# Patient Record
Sex: Male | Born: 1960 | Race: White | Hispanic: No | Marital: Married | Smoking: Former smoker
Health system: Southern US, Community
[De-identification: ages and names within clinical notes are randomized; demographics above are authoritative.]

## PROBLEM LIST (undated history)

## (undated) DIAGNOSIS — G4733 Obstructive sleep apnea (adult) (pediatric): Secondary | ICD-10-CM

## (undated) DIAGNOSIS — E785 Hyperlipidemia, unspecified: Secondary | ICD-10-CM

## (undated) HISTORY — DX: Obstructive sleep apnea (adult) (pediatric): G47.33

## (undated) HISTORY — PX: SHOULDER SURGERY: SHX246

## (undated) HISTORY — PX: NASAL SINUS SURGERY: SHX719

## (undated) HISTORY — DX: Hyperlipidemia, unspecified: E78.5

---

## 2002-07-26 ENCOUNTER — Ambulatory Visit (HOSPITAL_BASED_OUTPATIENT_CLINIC_OR_DEPARTMENT_OTHER): Admission: RE | Admit: 2002-07-26 | Discharge: 2002-07-26 | Payer: Self-pay | Admitting: Otolaryngology

## 2003-06-24 ENCOUNTER — Ambulatory Visit (HOSPITAL_COMMUNITY): Admission: RE | Admit: 2003-06-24 | Discharge: 2003-06-24 | Payer: Self-pay | Admitting: Family Medicine

## 2004-07-15 ENCOUNTER — Encounter: Admission: RE | Admit: 2004-07-15 | Discharge: 2004-07-15 | Payer: Self-pay | Admitting: Family Medicine

## 2007-02-18 ENCOUNTER — Emergency Department (HOSPITAL_COMMUNITY): Admission: EM | Admit: 2007-02-18 | Discharge: 2007-02-18 | Payer: Self-pay | Admitting: Emergency Medicine

## 2009-01-03 ENCOUNTER — Encounter: Admission: RE | Admit: 2009-01-03 | Discharge: 2009-01-03 | Payer: Self-pay | Admitting: Family Medicine

## 2009-01-13 ENCOUNTER — Encounter: Admission: RE | Admit: 2009-01-13 | Discharge: 2009-02-25 | Payer: Self-pay | Admitting: Family Medicine

## 2009-11-25 ENCOUNTER — Ambulatory Visit (HOSPITAL_BASED_OUTPATIENT_CLINIC_OR_DEPARTMENT_OTHER): Admission: RE | Admit: 2009-11-25 | Discharge: 2009-11-25 | Payer: Self-pay | Admitting: Otolaryngology

## 2009-11-29 ENCOUNTER — Ambulatory Visit: Payer: Self-pay | Admitting: Internal Medicine

## 2012-07-27 ENCOUNTER — Other Ambulatory Visit: Payer: Self-pay | Admitting: Specialist

## 2012-07-27 DIAGNOSIS — M25511 Pain in right shoulder: Secondary | ICD-10-CM

## 2012-08-01 ENCOUNTER — Ambulatory Visit
Admission: RE | Admit: 2012-08-01 | Discharge: 2012-08-01 | Disposition: A | Discharge status: Home / Self Care | Payer: BC Managed Care – PPO | Source: Ambulatory Visit | Attending: Specialist | Admitting: Specialist

## 2012-08-01 DIAGNOSIS — M25511 Pain in right shoulder: Secondary | ICD-10-CM

## 2013-07-23 ENCOUNTER — Ambulatory Visit: Payer: BC Managed Care – PPO | Admitting: Cardiology

## 2013-07-30 ENCOUNTER — Ambulatory Visit (INDEPENDENT_AMBULATORY_CARE_PROVIDER_SITE_OTHER): Payer: BC Managed Care – PPO | Admitting: Radiology

## 2013-07-30 ENCOUNTER — Encounter (INDEPENDENT_AMBULATORY_CARE_PROVIDER_SITE_OTHER): Payer: Self-pay

## 2013-07-30 DIAGNOSIS — R209 Unspecified disturbances of skin sensation: Secondary | ICD-10-CM

## 2013-07-30 DIAGNOSIS — G5600 Carpal tunnel syndrome, unspecified upper limb: Secondary | ICD-10-CM

## 2013-07-30 NOTE — Procedures (Signed)
     HISTORY:  Johnathan Grant is a 52 year old gentleman with a history of numbness in the first 3 fingers of the left hand off and on over the last one year prior to this evaluation. Similar symptoms are noted on the right and involving the fourth and fifth fingers. The patient has some shoulder discomfort, left arm pain. The patient is being evaluated for possible neuropathy.  NERVE CONDUCTION STUDIES:  Nerve conduction studies were performed on both upper extremities. The distal motor latencies for the median nerves were prolonged on the left, normal on the right, with normal motor amplitudes for these nerves bilaterally. The distal motor latencies and motor amplitudes for the ulnar nerves were normal bilaterally. The F wave latencies and nerve conduction velocities for the median and ulnar nerves were normal bilaterally. The sensory latencies for the median nerves were prolonged on the left, normal on the right, and normal for the ulnar and radial nerves bilaterally.  EMG STUDIES:  EMG evaluation was not performed.  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 No other significant abnormalities were seen. We would recommend EMG evaluation of the affected extremity a cervical radiculopathy is clinically suspected.  Marlan Palau MD 07/30/2013 4:38 PM  [facility] [address] [place], [place] [address]  Phone [phone] Fax [phone]

## 2013-08-24 ENCOUNTER — Encounter: Payer: Self-pay | Admitting: Cardiology

## 2013-08-24 ENCOUNTER — Ambulatory Visit (INDEPENDENT_AMBULATORY_CARE_PROVIDER_SITE_OTHER): Payer: BC Managed Care – PPO | Admitting: Cardiology

## 2013-08-24 VITALS — BP 148/90 | HR 81 | Ht 69.5 in | Wt 195.0 lb

## 2013-08-24 DIAGNOSIS — F17200 Nicotine dependence, unspecified, uncomplicated: Secondary | ICD-10-CM

## 2013-08-24 DIAGNOSIS — R079 Chest pain, unspecified: Secondary | ICD-10-CM

## 2013-08-24 DIAGNOSIS — E785 Hyperlipidemia, unspecified: Secondary | ICD-10-CM

## 2013-08-24 DIAGNOSIS — Z720 Tobacco use: Secondary | ICD-10-CM | POA: Insufficient documentation

## 2013-08-24 NOTE — Assessment & Plan Note (Signed)
Continue statin. Followed by primary care. 

## 2013-08-24 NOTE — Patient Instructions (Signed)
Your physician recommends that you schedule a follow-up appointment in: AS NEEDED  Your physician has requested that you have an exercise tolerance test. For further information please visit [email or web address]. Please also follow instruction sheet, as given.    Exercise Stress Electrocardiogram An exercise stress electrocardiogram is a test to check how blood flows to your heart. It is done to find areas of poor blood flow. You will need to walk on a treadmill for this test. The electrocardiogram will record your heartbeat when you are at rest and when you are exercising. BEFORE THE PROCEDURE  Do not have drinks with caffeine or foods with caffeine for 24 hours before the test, or as told by your doctor.  Follow your doctor's instructions about eating and drinking before the test.  Ask your doctor what medicines you should or should not take before the test. Take your medicines with water unless told by your doctor not to.  If you use an inhaler, bring it with you to the test.  Bring a snack to eat after the test.  Do not  smoke for 4 hours before the test.  Wear comfortable shoes and clothing. PROCEDURE  You will have patches put on your chest. Small areas of your chest may need to be shaved. Wires will be connected to the patches.  Your heart rate will be watched while you are resting and while you are exercising.  You will walk on the treadmill. The treadmill will slowly get faster to raise your heart rate.  The test will take about 1 2 hours. AFTER THE PROCEDURE  Your heart rate and blood pressure will be watched after the test.  You may return to your normal diet, activities, and medicines or as told by your doctor. Document Released: 01/12/2008 Document Revised: 05/16/2013 Document Reviewed: 04/02/2013 [facility] Patient Information 2014 [place].

## 2013-08-24 NOTE — Assessment & Plan Note (Signed)
Symptoms are atypical but multiple risk factors. Plan exercise treadmill.

## 2013-08-24 NOTE — Progress Notes (Signed)
     HPI: 53 year-old male for evaluation of chest pain. Patient apparently had an evaluation 5 years ago for chest pain. He had a negative nuclear study by his report. I do not have those records available. The patient states he has occasional pain in the left chest area. It occurs with vigorous activities. It lasts for 1 second and resolve spontaneously. These are similar to his symptoms 5 years ago when he had his nuclear study. The pain does not radiate, is not positional and is not related to food. No associated symptoms. He otherwise has dyspnea with more extreme activities but not routine activities. No orthopnea, PND or pedal edema. Because of the above we were asked to evaluate.  Current Outpatient Prescriptions  Medication Sig Dispense Refill  . atorvastatin (LIPITOR) 10 MG tablet Take 10 mg by mouth daily.      . Cetirizine HCl (ZYRTEC ALLERGY PO) Take by mouth as needed.      . fluticasone (FLONASE) 50 MCG/ACT nasal spray Place into both nostrils as needed for allergies or rhinitis.      Marland Kitchen. ibuprofen (ADVIL,MOTRIN) 200 MG tablet Take 200 mg by mouth every 6 (six) hours as needed.       No current facility-administered medications for this visit.    No Known Allergies  Past Medical History  Diagnosis Date  . Hyperlipidemia   . OSA (obstructive sleep apnea)     Past Surgical History  Procedure Laterality Date  . Shoulder surgery Left   . Nasal sinus surgery      History   Social History  . Marital Status: Married    Spouse Name: N/A    Number of Children: 3  . Years of Education: N/A   Occupational History  .  [employer]   Social History Main Topics  . Smoking status: Light Tobacco Smoker  . Smokeless tobacco: Not on file     Comment: Occasional smoker at poker games  . Alcohol Use: Yes  . Drug Use: Not on file  . Sexual Activity: Not on file   Other Topics Concern  . Not on file   Social History Narrative  . No narrative on file    Family  History  Problem Relation Age of Onset  . CAD Father     PCI at age 54  . Hypertension Mother   . CAD Sister     ROS: no fevers or chills, productive cough, hemoptysis, dysphasia, odynophagia, melena, hematochezia, dysuria, hematuria, rash, seizure activity, orthopnea, PND, pedal edema, claudication. Remaining systems are negative.  Physical Exam:   Blood pressure 148/90, pulse 81, height 5' 9.5" (1.765 m), weight 195 lb (88.451 kg).  General:  Well developed/well nourished in NAD Skin warm/dry Patient not depressed No peripheral clubbing Back-normal HEENT-normal/normal eyelids Neck supple/normal carotid upstroke bilaterally; no bruits; no JVD; no thyromegaly chest - CTA/ normal expansion CV - RRR/normal S1 and S2; no murmurs, rubs or gallops;  PMI nondisplaced Abdomen -NT/ND, no HSM, no mass, + bowel sounds, no bruit 2+ femoral pulses, no bruits Ext-no edema, chords, 2+ DP Neuro-grossly nonfocal  ECG Sinus rhythm, no ST changes.

## 2013-08-24 NOTE — Assessment & Plan Note (Signed)
Patient counseled on discontinuing. 

## 2013-08-28 ENCOUNTER — Ambulatory Visit (HOSPITAL_COMMUNITY)
Admission: RE | Admit: 2013-08-28 | Discharge: 2013-08-28 | Disposition: A | Discharge status: Home / Self Care | Payer: BC Managed Care – PPO | Source: Ambulatory Visit | Attending: Internal Medicine | Admitting: Internal Medicine

## 2013-08-28 DIAGNOSIS — R079 Chest pain, unspecified: Secondary | ICD-10-CM

## 2014-10-11 ENCOUNTER — Emergency Department (HOSPITAL_COMMUNITY): Payer: 59

## 2014-10-11 ENCOUNTER — Other Ambulatory Visit: Payer: Self-pay

## 2014-10-11 ENCOUNTER — Other Ambulatory Visit (HOSPITAL_COMMUNITY): Payer: Self-pay

## 2014-10-11 ENCOUNTER — Inpatient Hospital Stay (HOSPITAL_COMMUNITY)
Admission: EM | Admit: 2014-10-11 | Discharge: 2014-10-12 | DRG: 871 | Disposition: A | Discharge status: Home / Self Care | Payer: 59 | Attending: Internal Medicine | Admitting: Internal Medicine

## 2014-10-11 ENCOUNTER — Encounter (HOSPITAL_COMMUNITY): Payer: Self-pay | Admitting: Emergency Medicine

## 2014-10-11 DIAGNOSIS — E785 Hyperlipidemia, unspecified: Secondary | ICD-10-CM | POA: Diagnosis present

## 2014-10-11 DIAGNOSIS — F17210 Nicotine dependence, cigarettes, uncomplicated: Secondary | ICD-10-CM | POA: Diagnosis present

## 2014-10-11 DIAGNOSIS — R0902 Hypoxemia: Secondary | ICD-10-CM

## 2014-10-11 DIAGNOSIS — E860 Dehydration: Secondary | ICD-10-CM | POA: Diagnosis present

## 2014-10-11 DIAGNOSIS — E876 Hypokalemia: Secondary | ICD-10-CM | POA: Diagnosis present

## 2014-10-11 DIAGNOSIS — J101 Influenza due to other identified influenza virus with other respiratory manifestations: Secondary | ICD-10-CM

## 2014-10-11 DIAGNOSIS — R0600 Dyspnea, unspecified: Secondary | ICD-10-CM

## 2014-10-11 DIAGNOSIS — A419 Sepsis, unspecified organism: Principal | ICD-10-CM | POA: Diagnosis present

## 2014-10-11 DIAGNOSIS — J189 Pneumonia, unspecified organism: Secondary | ICD-10-CM | POA: Diagnosis present

## 2014-10-11 DIAGNOSIS — R509 Fever, unspecified: Secondary | ICD-10-CM

## 2014-10-11 DIAGNOSIS — I493 Ventricular premature depolarization: Secondary | ICD-10-CM | POA: Diagnosis present

## 2014-10-11 DIAGNOSIS — G4733 Obstructive sleep apnea (adult) (pediatric): Secondary | ICD-10-CM | POA: Diagnosis present

## 2014-10-11 DIAGNOSIS — J209 Acute bronchitis, unspecified: Secondary | ICD-10-CM | POA: Diagnosis present

## 2014-10-11 DIAGNOSIS — R05 Cough: Secondary | ICD-10-CM | POA: Diagnosis present

## 2014-10-11 LAB — CBC WITH DIFFERENTIAL/PLATELET
Basophils Absolute: 0 10*3/uL (ref 0.0–0.1)
Basophils Relative: 0 % (ref 0–1)
Eosinophils Absolute: 0 10*3/uL (ref 0.0–0.7)
Eosinophils Relative: 0 % (ref 0–5)
HCT: 39.3 % (ref 39.0–52.0)
Hemoglobin: 13.8 g/dL (ref 13.0–17.0)
Lymphocytes Relative: 7 % — ABNORMAL LOW (ref 12–46)
Lymphs Abs: 0.7 10*3/uL (ref 0.7–4.0)
MCH: 30.7 pg (ref 26.0–34.0)
MCHC: 35.1 g/dL (ref 30.0–36.0)
MCV: 87.5 fL (ref 78.0–100.0)
Monocytes Absolute: 0.6 10*3/uL (ref 0.1–1.0)
Monocytes Relative: 6 % (ref 3–12)
Neutro Abs: 8.2 10*3/uL — ABNORMAL HIGH (ref 1.7–7.7)
Neutrophils Relative %: 87 % — ABNORMAL HIGH (ref 43–77)
Platelets: 174 10*3/uL (ref 150–400)
RBC: 4.49 MIL/uL (ref 4.22–5.81)
RDW: 12.7 % (ref 11.5–15.5)
WBC: 9.4 10*3/uL (ref 4.0–10.5)

## 2014-10-11 LAB — COMPREHENSIVE METABOLIC PANEL
ALT: 56 U/L — ABNORMAL HIGH (ref 0–53)
AST: 54 U/L — ABNORMAL HIGH (ref 0–37)
Albumin: 3.8 g/dL (ref 3.5–5.2)
Alkaline Phosphatase: 50 U/L (ref 39–117)
Anion gap: 10 (ref 5–15)
BUN: 11 mg/dL (ref 6–23)
CO2: 24 mmol/L (ref 19–32)
Calcium: 8.3 mg/dL — ABNORMAL LOW (ref 8.4–10.5)
Chloride: 100 mmol/L (ref 96–112)
Creatinine, Ser: 1.02 mg/dL (ref 0.50–1.35)
GFR calc Af Amer: >90 mL/min (ref >90)
GFR calc non Af Amer: 81 mL/min — ABNORMAL LOW (ref >90)
Glucose, Bld: 127 mg/dL — ABNORMAL HIGH (ref 70–99)
Potassium: 3.3 mmol/L — ABNORMAL LOW (ref 3.5–5.1)
Sodium: 134 mmol/L — ABNORMAL LOW (ref 135–145)
Total Bilirubin: 0.7 mg/dL (ref 0.3–1.2)
Total Protein: 6.2 g/dL (ref 6.0–8.3)

## 2014-10-11 LAB — HEPATIC FUNCTION PANEL
ALK PHOS: 45 U/L (ref 39–117)
ALT: 57 U/L — AB (ref 0–53)
AST: 54 U/L — ABNORMAL HIGH (ref 0–37)
Albumin: 3.3 g/dL — ABNORMAL LOW (ref 3.5–5.2)
Total Bilirubin: 0.6 mg/dL (ref 0.3–1.2)
Total Protein: 5.4 g/dL — ABNORMAL LOW (ref 6.0–8.3)

## 2014-10-11 LAB — INFLUENZA PANEL BY PCR (TYPE A & B)
H1N1FLUPCR: NOT DETECTED
INFLAPCR: POSITIVE — AB
Influenza B By PCR: NEGATIVE

## 2014-10-11 LAB — CREATININE, SERUM
Creatinine, Ser: 0.99 mg/dL (ref 0.50–1.35)
GFR calc non Af Amer: >90 mL/min (ref >90)

## 2014-10-11 LAB — URINALYSIS, ROUTINE W REFLEX MICROSCOPIC
BILIRUBIN URINE: NEGATIVE
Glucose, UA: NEGATIVE mg/dL
HGB URINE DIPSTICK: NEGATIVE
Ketones, ur: NEGATIVE mg/dL
Leukocytes, UA: NEGATIVE
Nitrite: NEGATIVE
Protein, ur: NEGATIVE mg/dL
UROBILINOGEN UA: 0.2 mg/dL (ref 0.0–1.0)
pH: 5.5 (ref 5.0–8.0)

## 2014-10-11 LAB — D-DIMER, QUANTITATIVE (NOT AT ARMC): D DIMER QUANT: 0.65 ug{FEU}/mL — AB (ref 0.00–0.48)

## 2014-10-11 LAB — TROPONIN I: Troponin I: <0.03 ng/mL (ref <0.031)

## 2014-10-11 LAB — I-STAT CG4 LACTIC ACID, ED
LACTIC ACID, VENOUS: 2.2 mmol/L — AB (ref 0.5–2.0)
Lactic Acid, Venous: 1.78 mmol/L (ref 0.5–2.0)

## 2014-10-11 LAB — MAGNESIUM: Magnesium: 1.6 mg/dL (ref 1.5–2.5)

## 2014-10-11 LAB — I-STAT TROPONIN, ED: Troponin i, poc: 0 ng/mL (ref 0.00–0.08)

## 2014-10-11 LAB — BRAIN NATRIURETIC PEPTIDE: B Natriuretic Peptide: 85.5 pg/mL (ref 0.0–100.0)

## 2014-10-11 MED ORDER — ATORVASTATIN CALCIUM 10 MG PO TABS
10.0000 mg | ORAL_TABLET | Freq: Every day | ORAL | Status: DC
  Administered 2014-10-11 – 2014-10-12 (×2): 10 mg via ORAL
  Filled 2014-10-11 (×2): qty 1

## 2014-10-11 MED ORDER — OXYCODONE HCL 5 MG PO TABS: 5.0000 mg | ORAL_TABLET | ORAL | Status: DC | PRN

## 2014-10-11 MED ORDER — SODIUM CHLORIDE 0.9 % IV SOLN: INTRAVENOUS | Status: DC

## 2014-10-11 MED ORDER — OSELTAMIVIR PHOSPHATE 75 MG PO CAPS
75.0000 mg | ORAL_CAPSULE | Freq: Two times a day (BID) | ORAL | Status: DC
  Administered 2014-10-11 – 2014-10-12 (×2): 75 mg via ORAL
  Filled 2014-10-11 (×3): qty 1

## 2014-10-11 MED ORDER — FLUTICASONE PROPIONATE 50 MCG/ACT NA SUSP
1.0000 | NASAL | Status: DC | PRN
  Filled 2014-10-11: qty 16

## 2014-10-11 MED ORDER — DEXTROSE 5 % IV SOLN
500.0000 mg | INTRAVENOUS | Status: DC
  Filled 2014-10-11: qty 500

## 2014-10-11 MED ORDER — ENOXAPARIN SODIUM 40 MG/0.4ML ~~LOC~~ SOLN
40.0000 mg | SUBCUTANEOUS | Status: DC
  Administered 2014-10-11: 40 mg via SUBCUTANEOUS
  Filled 2014-10-11 (×2): qty 0.4

## 2014-10-11 MED ORDER — DEXTROSE 5 % IV SOLN
1.0000 g | INTRAVENOUS | Status: DC
  Filled 2014-10-11: qty 10

## 2014-10-11 MED ORDER — SODIUM CHLORIDE 0.9 % IJ SOLN
3.0000 mL | Freq: Two times a day (BID) | INTRAMUSCULAR | Status: DC
  Administered 2014-10-11 – 2014-10-12 (×2): 3 mL via INTRAVENOUS

## 2014-10-11 MED ORDER — ACETAMINOPHEN 325 MG PO TABS
650.0000 mg | ORAL_TABLET | Freq: Four times a day (QID) | ORAL | Status: DC | PRN
  Administered 2014-10-11: 650 mg via ORAL
  Filled 2014-10-11: qty 2

## 2014-10-11 MED ORDER — LEVALBUTEROL HCL 0.63 MG/3ML IN NEBU
0.6300 mg | INHALATION_SOLUTION | Freq: Four times a day (QID) | RESPIRATORY_TRACT | Status: DC
Start: 2014-10-11 — End: 2014-10-12
  Administered 2014-10-11 – 2014-10-12 (×3): 0.63 mg via RESPIRATORY_TRACT
  Filled 2014-10-11 (×7): qty 3

## 2014-10-11 MED ORDER — POTASSIUM CHLORIDE IN NACL 40-0.9 MEQ/L-% IV SOLN
INTRAVENOUS | Status: DC
  Administered 2014-10-11: 75 mL/h via INTRAVENOUS
  Filled 2014-10-11 (×3): qty 1000

## 2014-10-11 MED ORDER — METHYLPREDNISOLONE SODIUM SUCC 125 MG IJ SOLR
60.0000 mg | Freq: Two times a day (BID) | INTRAMUSCULAR | Status: DC
  Administered 2014-10-12: 60 mg via INTRAVENOUS
  Filled 2014-10-11 (×2): qty 0.96
  Filled 2014-10-11: qty 2

## 2014-10-11 MED ORDER — SODIUM CHLORIDE 0.9 % IV BOLUS (SEPSIS)
1000.0000 mL | INTRAVENOUS | Status: AC
  Administered 2014-10-11 (×3): 1000 mL via INTRAVENOUS

## 2014-10-11 MED ORDER — IPRATROPIUM BROMIDE 0.02 % IN SOLN
0.5000 mg | Freq: Four times a day (QID) | RESPIRATORY_TRACT | Status: DC
  Administered 2014-10-11 – 2014-10-12 (×3): 0.5 mg via RESPIRATORY_TRACT
  Filled 2014-10-11 (×3): qty 2.5

## 2014-10-11 MED ORDER — ACETAMINOPHEN 650 MG RE SUPP: 650.0000 mg | Freq: Four times a day (QID) | RECTAL | Status: DC | PRN

## 2014-10-11 MED ORDER — DEXTROSE 5 % IV SOLN
500.0000 mg | Freq: Once | INTRAVENOUS | Status: AC
  Administered 2014-10-11: 500 mg via INTRAVENOUS
  Filled 2014-10-11: qty 500

## 2014-10-11 MED ORDER — ONDANSETRON HCL 4 MG PO TABS: 4.0000 mg | ORAL_TABLET | Freq: Four times a day (QID) | ORAL | Status: DC | PRN

## 2014-10-11 MED ORDER — ONDANSETRON HCL 4 MG/2ML IJ SOLN: 4.0000 mg | Freq: Four times a day (QID) | INTRAMUSCULAR | Status: DC | PRN

## 2014-10-11 MED ORDER — IOHEXOL 350 MG/ML SOLN
80.0000 mL | Freq: Once | INTRAVENOUS | Status: AC | PRN
  Administered 2014-10-11: 80 mL via INTRAVENOUS

## 2014-10-11 MED ORDER — DEXTROSE 5 % IV SOLN
1.0000 g | Freq: Once | INTRAVENOUS | Status: AC
  Administered 2014-10-11: 1 g via INTRAVENOUS
  Filled 2014-10-11: qty 10

## 2014-10-11 MED ORDER — METHYLPREDNISOLONE SODIUM SUCC 125 MG IJ SOLR
125.0000 mg | Freq: Once | INTRAMUSCULAR | Status: AC
  Administered 2014-10-11: 125 mg via INTRAVENOUS
  Filled 2014-10-11: qty 2

## 2014-10-11 MED ORDER — IPRATROPIUM-ALBUTEROL 0.5-2.5 (3) MG/3ML IN SOLN
3.0000 mL | Freq: Once | RESPIRATORY_TRACT | Status: AC
  Administered 2014-10-11: 3 mL via RESPIRATORY_TRACT
  Filled 2014-10-11: qty 3

## 2014-10-11 NOTE — ED Provider Notes (Signed)
CSN: [id number]     Arrival date & time 10/11/14  1304 History   First MD Initiated Contact with Patient 10/11/14 1329     Chief Complaint  Patient presents with  . Shortness of Breath  . Fever     (Consider location/radiation/quality/duration/timing/severity/associated sxs/prior Treatment) HPI The patient went out of town on Sunday by airlines to [place]. He reports on the trip out he sat next to a small child who coughed throughout the trip. He worked on Monday and Tuesday and began to get symptoms of chest congestion, cough and achiness/fatigue.Marland Kitchen. He returned home yesterday evening and has developed a fever up to 103 Tmax. His cough and shortness of breath worsened. The patient denies generalized bodyaches or headache. He denies swelling of his legs or pain behind the calves or knees. He saw his family doctor in the office today and was noted to be hypoxic and febrile. He was sent to the emergency department with concerns for pneumonia. The patient denies he's had any vomiting or diarrhea. He has had no rashes and no joint swelling. The patient has not had influenza shot this year. Past Medical History  Diagnosis Date  . Hyperlipidemia   . OSA (obstructive sleep apnea)    Past Surgical History  Procedure Laterality Date  . Shoulder surgery Left   . Nasal sinus surgery     Family History  Problem Relation Age of Onset  . CAD Father     PCI at age 55  . Hypertension Mother   . CAD Sister    History  Substance Use Topics  . Smoking status: Light Tobacco Smoker  . Smokeless tobacco: Not on file     Comment: Occasional smoker at poker games  . Alcohol Use: Yes    Review of Systems 10 Systems reviewed and are negative for acute change except as noted in the HPI.    Allergies  Review of patient's allergies indicates no known allergies.  Home Medications   Prior to Admission medications   Medication Sig Start Date End Date Taking? Authorizing Provider  atorvastatin  (LIPITOR) 10 MG tablet Take 10 mg by mouth daily.   Yes Historical Provider, MD  Cetirizine HCl (ZYRTEC ALLERGY PO) Take 10 mg by mouth as needed (allergies, congestion).    Yes Historical Provider, MD  fluticasone (FLONASE) 50 MCG/ACT nasal spray Place 1 spray into both nostrils as needed for allergies or rhinitis.    Yes Historical Provider, MD  ibuprofen (ADVIL,MOTRIN) 200 MG tablet Take 200 mg by mouth every 6 (six) hours as needed.   Yes Historical Provider, MD   BP 103/59 mmHg  Pulse 93  Temp(Src) 100.6 F (38.1 C) (Oral)  Resp 19  Wt 194 lb (87.998 kg)  SpO2 94% Physical Exam  Constitutional: He is oriented to person, place, and time. He appears well-developed and well-nourished.  The patient is alert and nontoxic. He is mildly tachypnea.  HENT:  Head: Normocephalic and atraumatic.  Right Ear: External ear normal.  Left Ear: External ear normal.  Nose: Nose normal.  Mouth/Throat: Oropharynx is clear and moist. No oropharyngeal exudate.  Eyes: Conjunctivae and EOM are normal. Pupils are equal, round, and reactive to light. Right eye exhibits no discharge. Left eye exhibits no discharge.  Neck: Neck supple.  Cardiovascular: Normal heart sounds and intact distal pulses.   Tachycardia. No rub murmur gallop. Normal peripheral pulses.  Pulmonary/Chest: He has no rales.  Patient has mild tachypnea. He is speaking in full sentences and minimal  respiratory distress. Breath sounds are diminished at the bases and there are fine expiratory wheeze.  Abdominal: Soft. Bowel sounds are normal. He exhibits no distension and no mass. There is no tenderness. There is no guarding.  Musculoskeletal: Normal range of motion. He exhibits no edema or tenderness.  Lymphadenopathy:    He has no cervical adenopathy.  Neurological: He is alert and oriented to person, place, and time. No cranial nerve deficit. He exhibits normal muscle tone. Coordination normal.  Skin: Skin is warm and dry. No rash noted.  No erythema. No pallor.  Psychiatric: He has a normal mood and affect.    ED Course  Procedures (including critical care time) Labs Review Labs Reviewed  CBC WITH DIFFERENTIAL/PLATELET - Abnormal; Notable for the following:    Neutrophils Relative % 87 (*)    Neutro Abs 8.2 (*)    Lymphocytes Relative 7 (*)    All other components within normal limits  D-DIMER, QUANTITATIVE - Abnormal; Notable for the following:    D-Dimer, Quant 0.65 (*)    All other components within normal limits  CULTURE, BLOOD (ROUTINE X 2)  CULTURE, BLOOD (ROUTINE X 2)  URINE CULTURE  COMPREHENSIVE METABOLIC PANEL  URINALYSIS, ROUTINE W REFLEX MICROSCOPIC  BRAIN NATRIURETIC PEPTIDE  TROPONIN I  INFLUENZA PANEL BY PCR (TYPE A & B, H1N1)  I-STAT TROPOININ, ED  I-STAT CG4 LACTIC ACID, ED    Imaging Review Dg Chest 2 View  10/11/2014   CLINICAL DATA:  Shortness of breath and fever since 5 days ago.  EXAM: CHEST  2 VIEW  COMPARISON:  None.  FINDINGS: Heart size is normal. Mediastinal shadows are normal. There is mild central bronchial thickening but no infiltrate, collapse or effusion. No bony abnormalities.  IMPRESSION: Mild bronchial thickening.  No consolidation or collapse.   Electronically Signed   By: Paulina Fusi M.D.   On: 10/11/2014 13:52     EKG Interpretation   Date/Time:  Friday October 11 2014 13:13:17 EST Ventricular Rate:  115 PR Interval:  134 QRS Duration: 96 QT Interval:  294 QTC Calculation: 406 R Axis:   89 Text Interpretation:  Sinus tachycardia with occasional Premature  ventricular complexes ST \\T \ T wave abnormality, consider inferolateral  ischemia Abnormal ECG agree. no STEMI Confirmed by Donnald Garre, MD, Lebron Conners  [phone]) on 10/11/2014 2:06:18 PM     CRITICAL CARE Performed by: Arby Barrette   Total critical care time: 40  Critical care time was exclusive of separately billable procedures and treating other patients.  Critical care was necessary to treat or prevent imminent  or life-threatening deterioration.  Critical care was time spent personally by me on the following activities: development of treatment plan with patient and/or surrogate as well as nursing, discussions with consultants, evaluation of patient's response to treatment, examination of patient, obtaining history from patient or surrogate, ordering and performing treatments and interventions, ordering and review of laboratory studies, ordering and review of radiographic studies, pulse oximetry and re-evaluation of patient's condition. MDM   Final diagnoses:  Hypoxia  Other specified fever  Dyspnea    Patient sent from PCP with hypoxia, fever, hypotension and myalgia with concern for pneumonia. Sepsis protocol for CAP initiated with SIRS criteria. DDx viral vs bacterial respiratory illness. PE ruled out due to travel Hx and elevated D dimer. After initial tx, pt remained significantly dyspneic with minor exertion and decreased BS and wheeze. Patient admitted for hypoxia and significant DOE with respiratory illness, r/o pneumonia/sepsis/viral pneuminia.    Lebron Conners  Donnald Garre, MD 10/14/14 1535

## 2014-10-11 NOTE — Progress Notes (Signed)
RN called for report.  

## 2014-10-11 NOTE — ED Notes (Signed)
Pt transported to CT ?

## 2014-10-11 NOTE — ED Notes (Signed)
Pt sts fever, SOB and aches x 3 days; pt sent from PCP for further eval

## 2014-10-11 NOTE — Progress Notes (Signed)
ANTIBIOTIC CONSULT NOTE - INITIAL  Pharmacy Consult for Ceftriaxone, Azithromycin Indication: pneumonia  No Known Allergies  Patient Measurements: Weight: 194 lb (87.998 kg)  Vital Signs: Temp: 100.6 F (38.1 C) (03/04 1410) Temp Source: Oral (03/04 1410) BP: 120/61 mmHg (03/04 1400) Pulse Rate: 106 (03/04 1400) Intake/Output from previous day:   Intake/Output from this shift:    Labs: No results for input(s): WBC, HGB, PLT, LABCREA, CREATININE in the last 72 hours. CrCl cannot be calculated (Unknown ideal weight.). No results for input(s): VANCOTROUGH, VANCOPEAK, VANCORANDOM, GENTTROUGH, GENTPEAK, GENTRANDOM, TOBRATROUGH, TOBRAPEAK, TOBRARND, AMIKACINPEAK, AMIKACINTROU, AMIKACIN in the last 72 hours.   Microbiology: No results found for this or any previous visit (from the past 720 hour(s)).  Medical History: Past Medical History  Diagnosis Date  . Hyperlipidemia   . OSA (obstructive sleep apnea)     Medications:  Anti-infectives    Start     Dose/Rate Route Frequency Ordered Stop   10/11/14 1430  cefTRIAXone (ROCEPHIN) 1 g in dextrose 5 % 50 mL IVPB     1 g 100 mL/hr over 30 Minutes Intravenous  Once 10/11/14 1420     10/11/14 1430  azithromycin (ZITHROMAX) 500 mg in dextrose 5 % 250 mL IVPB     500 mg 250 mL/hr over 60 Minutes Intravenous  Once 10/11/14 1420       Assessment: 54 year old male presenting with pneumonia and possible sepsis to continue antibiotic therapy with Ceftriaxone and Azithromycin.  Initial doses were ordered by the ED provider.  Plan:  Ceftriaxone 1gm IV q24h Azithromycin 500mg  IV q24h As no dosage adjustments are required pharmacy will sign off. Thank you for the consult.  Estella HuskMichelle Sovereign Ramiro, Pharm.D., BCPS, AAHIVP Clinical Pharmacist Phone: [phone] or [phone] 10/11/2014, 2:31 PM

## 2014-10-11 NOTE — H&P (Addendum)
[facility] History and Physical  Johnathan Grant ZOX:[id number] DOB: August 12, 1960 DOA: 10/11/2014  Referring physician: PCP: Darrow Bussing, MD   Chief Complaint: Shortness of breath and cough  HPI:  54 year old male with a history of obstructive sleep apnea, status post trip to [facility], who came in contact with a sick child on the plane,, presents today with cough congestion myalgias sore throat and a fever of 103. Symptoms worsening over the last couple of days. Patient saw his PCP today and was sent to the emergency department because of concerns about pneumonia. No diarrhea no vomiting.  Workup in the ED showed elevated d-dimer of 0.65. CT scan showed acute bronchitis, no pulmonary embolism  Review of Systems: negative for the following  Constitutional: Positive for fever, chills, diaphoresis, appetite change and fatigue.  HEENT: Denies photophobia, eye pain, redness, hearing loss, ear pain, congestion, sore throat, rhinorrhea, sneezing, mouth sores, trouble swallowing, neck pain, neck stiffness and tinnitus.  Respiratory: Positive for SOB, DOE, positive for cough, chest tightness, and wheezing.  Cardiovascular: Denies chest pain, palpitations and leg swelling.  Gastrointestinal: Denies nausea, vomiting, abdominal pain, diarrhea, constipation, blood in stool and abdominal distention.  Genitourinary: Denies dysuria, urgency, frequency, hematuria, flank pain and difficulty urinating.  Musculoskeletal: Denies myalgias, back pain, joint swelling, arthralgias and gait problem.  Skin: Denies pallor, rash and wound.  Neurological: Denies dizziness, seizures, syncope, weakness, light-headedness, numbness and headaches.  Hematological: Denies adenopathy. Easy bruising, personal or family bleeding history  Psychiatric/Behavioral: Denies suicidal ideation, mood changes, confusion, nervousness, sleep disturbance and agitation       Past Medical History  Diagnosis Date  . Hyperlipidemia    . OSA (obstructive sleep apnea)      Past Surgical History  Procedure Laterality Date  . Shoulder surgery Left   . Nasal sinus surgery        Social History:  reports that he has been smoking.  He does not have any smokeless tobacco history on file. He reports that he drinks alcohol. His drug history is not on file.    No Known Allergies  Family History  Problem Relation Age of Onset  . CAD Father     PCI at age 40  . Hypertension Mother   . CAD Sister           Prior to Admission medications   Medication Sig Start Date End Date Taking? Authorizing Provider  atorvastatin (LIPITOR) 10 MG tablet Take 10 mg by mouth daily.   Yes Historical Provider, MD  Cetirizine HCl (ZYRTEC ALLERGY PO) Take 10 mg by mouth as needed (allergies, congestion).    Yes Historical Provider, MD  fluticasone (FLONASE) 50 MCG/ACT nasal spray Place 1 spray into both nostrils as needed for allergies or rhinitis.    Yes Historical Provider, MD  ibuprofen (ADVIL,MOTRIN) 200 MG tablet Take 200 mg by mouth every 6 (six) hours as needed.   Yes Historical Provider, MD     Physical Exam: Filed Vitals:   10/11/14 1614 10/11/14 1630 10/11/14 1645 10/11/14 1657  BP:  119/65    Pulse: 90  102   Temp:    99.4 F (37.4 C)  TempSrc:    Oral  Resp: Weight:      SpO2: 91%  93%      Constitutional: Febrile,. Patient is a well-developed and well-nourished in no acute distress and cooperative with exam. Alert and oriented x3.  Head: Normocephalic and atraumatic  Ear: TM normal bilaterally  Mouth:  no erythema or exudates, MMM  Eyes: PERRL, EOMI, conjunctivae normal, No scleral icterus.  Neck: Supple, Trachea midline normal ROM, No JVD, mass, thyromegaly, or carotid bruit present.  Cardiovascular: Tachycardic, S1 normal, S2 normal, no MRG, pulses symmetric and intact bilaterally  Pulmonary/Chest: CTAB, by basilar wheezing wheezes, rales, or rhonchi  Abdominal: Soft. Non-tender, non-distended,  bowel sounds are normal, no masses, organomegaly, or guarding present.  GU: no CVA tenderness Musculoskeletal: No joint deformities, erythema, or stiffness, ROM full and no nontender Ext: no edema and no cyanosis, pulses palpable bilaterally (DP and PT)  Hematology: no cervical, inginal, or axillary adenopathy.  Neurological: A&O x3, Strenght is normal and symmetric bilaterally, cranial nerve II-XII are grossly intact, no focal motor deficit, sensory intact to light touch bilaterally.  Skin: Warm, dry and intact. No rash, cyanosis, or clubbing.  Psychiatric: Normal mood and affect. speech and behavior is normal. Judgment and thought content normal. Cognition and memory are normal.       Labs on Admission:    Basic Metabolic Panel:  Recent Labs Lab 10/11/14 1404  NA 134*  K 3.3*  CL 100  CO2 24  GLUCOSE 127*  BUN 11  CREATININE 1.02  CALCIUM 8.3*   Liver Function Tests:  Recent Labs Lab 10/11/14 1404  AST 54*  ALT 56*  ALKPHOS 50  BILITOT 0.7  PROT 6.2  ALBUMIN 3.8   No results for input(s): LIPASE, AMYLASE in the last 168 hours. No results for input(s): AMMONIA in the last 168 hours. CBC:  Recent Labs Lab 10/11/14 1404  WBC 9.4  NEUTROABS 8.2*  HGB 13.8  HCT 39.3  MCV 87.5  PLT 174   Cardiac Enzymes:  Recent Labs Lab 10/11/14 1404  TROPONINI <0.03    BNP (last 3 results)  Recent Labs  10/11/14 1404  BNP 85.5    ProBNP (last 3 results) No results for input(s): PROBNP in the last 8760 hours.     CBG: No results for input(s): GLUCAP in the last 168 hours.  Radiological Exams on Admission: Dg Chest 2 View  10/11/2014   CLINICAL DATA:  Shortness of breath and fever since 5 days ago.  EXAM: CHEST  2 VIEW  COMPARISON:  None.  FINDINGS: Heart size is normal. Mediastinal shadows are normal. There is mild central bronchial thickening but no infiltrate, collapse or effusion. No bony abnormalities.  IMPRESSION: Mild bronchial thickening.  No  consolidation or collapse.   Electronically Signed   By: Paulina FusiMark  Shogry M.D.   On: 10/11/2014 13:52   Ct Angio Chest Pe W/cm &/or Wo Cm  10/11/2014   CLINICAL DATA:  Shortness of breath, fever and cough, 2 days duration.  EXAM: CT ANGIOGRAPHY CHEST WITH CONTRAST  TECHNIQUE: Multidetector CT imaging of the chest was performed using the standard protocol during bolus administration of intravenous contrast. Multiplanar CT image reconstructions and MIPs were obtained to evaluate the vascular anatomy.  CONTRAST:  80mL OMNIPAQUE IOHEXOL 350 MG/ML SOLN  COMPARISON:  Chest radiography same day  FINDINGS: There is widespread bronchial thickening consistent with bronchitis. No evidence of pneumonia. There is mild-to-moderate emphysema. There is a small area of focal scarring posteriorly in the right upper lobe. No evidence of mass lesion. No pleural or pericardial fluid.  Pulmonary arterial opacification is excellent. There are no pulmonary emboli. There is mild atherosclerosis of the aorta. No aneurysm or dissection. There may be mild coronary calcification in the left system.  There is no mediastinal or hilar mass. Normal size  lymph nodes are present.  Scans in the upper abdomen are unremarkable. There is a sub cm low density in the right lobe of the liver likely to be benign.  No spinal abnormality.  Ordinary bridging osteophytes.  Review of the MIP images confirms the above findings.  IMPRESSION: No pulmonary emboli.  Widespread bronchial thickening consistent with bronchitis. No consolidation or collapse.   Electronically Signed   By: Paulina Fusi M.D.   On: 10/11/2014 16:05    EKG: Independently reviewed.  Date/Time: Friday October 11 2014 13:13:17 EST Ventricular Rate: 115 PR Interval: 134 QRS Duration: 96 QT Interval: 294 QTC Calculation: 406 R Axis: 89 Text Interpretation: Sinus tachycardia with occasional Premature  ventricular complexes  Assessment/Plan Active Problems:   Acute  bronchitis   Acute bronchitis Likely viral, cannot rule out early pneumonia Influenza PCR pending Continue Rocephin/azithromycin Continue scheduled nebulizer treatments Continue IV Solu-Medrol 60 mg every 12, received 125 mg in the ED tamiflu empirically, dc if flu pcr negative doppler to r/o dvt  Hypokalemia Likely in the setting of dehydration, we will replete and hydrate the patient  PVCs/tachycardia Likely in the setting of fever and hypokalemia Hydrate the patient  Dyslipidemia Continue Lipitor  Code Status:   full Family Communication: bedside Disposition Plan: admit   Time spent: 70 mins   [facility] Pager [phone]  If 7PM-7AM, please contact night-coverage [email or web address] Password [facility] 10/11/2014, 6:15 PM

## 2014-10-12 ENCOUNTER — Encounter (HOSPITAL_COMMUNITY): Payer: Self-pay | Admitting: *Deleted

## 2014-10-12 DIAGNOSIS — R0902 Hypoxemia: Secondary | ICD-10-CM

## 2014-10-12 DIAGNOSIS — J101 Influenza due to other identified influenza virus with other respiratory manifestations: Secondary | ICD-10-CM

## 2014-10-12 DIAGNOSIS — J208 Acute bronchitis due to other specified organisms: Secondary | ICD-10-CM

## 2014-10-12 LAB — COMPREHENSIVE METABOLIC PANEL
ALT: 100 U/L — ABNORMAL HIGH (ref 0–53)
AST: 88 U/L — ABNORMAL HIGH (ref 0–37)
Albumin: 3.5 g/dL (ref 3.5–5.2)
Alkaline Phosphatase: 48 U/L (ref 39–117)
Anion gap: 6 (ref 5–15)
BUN: 9 mg/dL (ref 6–23)
CO2: 27 mmol/L (ref 19–32)
Calcium: 8.3 mg/dL — ABNORMAL LOW (ref 8.4–10.5)
Chloride: 103 mmol/L (ref 96–112)
Creatinine, Ser: 0.79 mg/dL (ref 0.50–1.35)
GFR calc Af Amer: >90 mL/min (ref >90)
GFR calc non Af Amer: >90 mL/min (ref >90)
Glucose, Bld: 141 mg/dL — ABNORMAL HIGH (ref 70–99)
Potassium: 3.9 mmol/L (ref 3.5–5.1)
Sodium: 136 mmol/L (ref 135–145)
Total Bilirubin: 0.5 mg/dL (ref 0.3–1.2)
Total Protein: 6 g/dL (ref 6.0–8.3)

## 2014-10-12 LAB — CBC
HCT: 37.3 % — ABNORMAL LOW (ref 39.0–52.0)
Hemoglobin: 13.3 g/dL (ref 13.0–17.0)
MCH: 30.9 pg (ref 26.0–34.0)
MCHC: 35.7 g/dL (ref 30.0–36.0)
MCV: 86.7 fL (ref 78.0–100.0)
Platelets: 187 10*3/uL (ref 150–400)
RBC: 4.3 MIL/uL (ref 4.22–5.81)
RDW: 12.9 % (ref 11.5–15.5)
WBC: 8.2 10*3/uL (ref 4.0–10.5)

## 2014-10-12 MED ORDER — PREDNISONE 5 MG PO TABS: 5.0000 mg | ORAL_TABLET | Freq: Every day | ORAL | Status: DC

## 2014-10-12 MED ORDER — ALBUTEROL SULFATE HFA 108 (90 BASE) MCG/ACT IN AERS: 2.0000 | INHALATION_SPRAY | Freq: Four times a day (QID) | RESPIRATORY_TRACT | Status: AC | PRN

## 2014-10-12 MED ORDER — OSELTAMIVIR PHOSPHATE 75 MG PO CAPS: 75.0000 mg | ORAL_CAPSULE | Freq: Two times a day (BID) | ORAL | Status: DC

## 2014-10-12 NOTE — Progress Notes (Signed)
VASCULAR LAB PRELIMINARY  PRELIMINARY  PRELIMINARY  PRELIMINARY  Bilateral lower extremity venous Dopplers completed.    Preliminary report:  There is no DVT or SVT noted in the bilateral lower extremities.   Hagen Tidd, RVT 10/12/2014, 11:29 AM

## 2014-10-12 NOTE — H&P (Signed)
Physician Discharge Summary  Johnathan PERUSSE ZOX:[id number] DOB: March 25, 1961 DOA: 10/11/2014  PCP: Darrow Bussing, MD  Admit date: 10/11/2014 Discharge date: 10/12/2014  Time spent: 25 minutes  Recommendations for Outpatient Follow-up:  1. Follow up with PCP in 1-2 weeks 2. Consider prophylactic Tamiflu for close family contacts  Discharge Diagnoses:  Principal Problem:   Influenza A Active Problems:   Acute bronchitis   Discharge Condition: Improved  Diet recommendation: Regular  Filed Weights   10/11/14 1317 10/11/14 1908  Weight: 87.998 kg (194 lb) 87.998 kg (194 lb)    History of present illness:  Please see admit h and p from 3/4 for details. Briefly, pt presents with fevers, cough, sob two days after traveling out of state via air travel while sitting next to acutely ill passengers.  Hospital Course:  The patient was admitted to the floor. The patient was initially started on empiric abx to cover CAP. Flu swab was later found pos for flu A. The patient was started on tamiflu. During the course, the patient has noted improvement with bronchodilators and steroids. He will discharged home with albuterol and prednisone taper. Of note, d-dimer was mildly elevated. CTA was neg for PE. LE dopplers were neg for DVT.  Procedures: B LE dopplers - neg for DVT  Consultations:  none  Discharge Exam: Filed Vitals:   10/11/14 1908 10/11/14 2045 10/11/14 2049 10/12/14 0546  BP: 125/67  121/69 122/75  Pulse: 90  94 65  Temp: 100.2 F (37.9 C)  99.6 F (37.6 C) 98.3 F (36.8 C)  TempSrc: Oral  Oral Oral  Resp: 18  20   Height:  (1.753 m)     Weight: 87.998 kg (194 lb)     SpO2: 93% 95% 95% 98%    General: awake, in nad Cardiovascular: regular,s1, s2 Respiratory: normal resp effort, no wheezing  Discharge Instructions     Medication List    TAKE these medications        albuterol 108 (90 BASE) MCG/ACT inhaler  Commonly known as:  PROVENTIL HFA;VENTOLIN HFA   Inhale 2 puffs into the lungs every 6 (six) hours as needed for wheezing or shortness of breath.     atorvastatin 10 MG tablet  Commonly known as:  LIPITOR  Take 10 mg by mouth daily.     fluticasone 50 MCG/ACT nasal spray  Commonly known as:  FLONASE  Place 1 spray into both nostrils as needed for allergies or rhinitis.     ibuprofen 200 MG tablet  Commonly known as:  ADVIL,MOTRIN  Take 200 mg by mouth every 6 (six) hours as needed.     oseltamivir 75 MG capsule  Commonly known as:  TAMIFLU  Take 1 capsule (75 mg total) by mouth 2 (two) times daily.     predniSONE 5 MG tablet  Commonly known as:  DELTASONE  Take 1 tablet (5 mg total) by mouth daily with breakfast.     ZYRTEC ALLERGY PO  Take 10 mg by mouth as needed (allergies, congestion).       No Known Allergies Follow-up Information    Follow up with Darrow Bussing, MD. Schedule an appointment as soon as possible for a visit in 1 week.   Specialty:  Family Medicine   Contact information:   [address] [place] [address] [phone]        The results of significant diagnostics from this hospitalization (including imaging, microbiology, ancillary and laboratory) are listed below for reference.  Significant Diagnostic Studies: Dg Chest 2 View  10/11/2014   CLINICAL DATA:  Shortness of breath and fever since 5 days ago.  EXAM: CHEST  2 VIEW  COMPARISON:  None.  FINDINGS: Heart size is normal. Mediastinal shadows are normal. There is mild central bronchial thickening but no infiltrate, collapse or effusion. No bony abnormalities.  IMPRESSION: Mild bronchial thickening.  No consolidation or collapse.   Electronically Signed   By: Paulina FusiMark  Shogry M.D.   On: 10/11/2014 13:52   Ct Angio Chest Pe W/cm &/or Wo Cm  10/11/2014   CLINICAL DATA:  Shortness of breath, fever and cough, 2 days duration.  EXAM: CT ANGIOGRAPHY CHEST WITH CONTRAST  TECHNIQUE: Multidetector CT imaging of the chest was performed  using the standard protocol during bolus administration of intravenous contrast. Multiplanar CT image reconstructions and MIPs were obtained to evaluate the vascular anatomy.  CONTRAST:  80mL OMNIPAQUE IOHEXOL 350 MG/ML SOLN  COMPARISON:  Chest radiography same day  FINDINGS: There is widespread bronchial thickening consistent with bronchitis. No evidence of pneumonia. There is mild-to-moderate emphysema. There is a small area of focal scarring posteriorly in the right upper lobe. No evidence of mass lesion. No pleural or pericardial fluid.  Pulmonary arterial opacification is excellent. There are no pulmonary emboli. There is mild atherosclerosis of the aorta. No aneurysm or dissection. There may be mild coronary calcification in the left system.  There is no mediastinal or hilar mass. Normal size lymph nodes are present.  Scans in the upper abdomen are unremarkable. There is a sub cm low density in the right lobe of the liver likely to be benign.  No spinal abnormality.  Ordinary bridging osteophytes.  Review of the MIP images confirms the above findings.  IMPRESSION: No pulmonary emboli.  Widespread bronchial thickening consistent with bronchitis. No consolidation or collapse.   Electronically Signed   By: Paulina FusiMark  Shogry M.D.   On: 10/11/2014 16:05    Microbiology: Recent Results (from the past 240 hour(s))  Blood Culture (routine x 2)     Status: None (Preliminary result)   Collection Time: 10/11/14  1:59 PM  Result Value Ref Range Status   Specimen Description BLOOD UPPER BLOOD LEFT FOREARM  Final   Special Requests BOTTLES DRAWN AEROBIC AND ANAEROBIC 5CCS  Final   Culture   Final           BLOOD CULTURE RECEIVED NO GROWTH TO DATE CULTURE WILL BE HELD FOR 5 DAYS BEFORE ISSUING A FINAL NEGATIVE REPORT [facility]    Report Status PENDING  Incomplete  Blood Culture (routine x 2)     Status: None (Preliminary result)   Collection Time: 10/11/14  2:02 PM  Result Value Ref Range  Status   Specimen Description BLOOD LEFT HAND  Final   Special Requests BOTTLES DRAWN AEROBIC AND ANAEROBIC 5CCS  Final   Culture   Final           BLOOD CULTURE RECEIVED NO GROWTH TO DATE CULTURE WILL BE HELD FOR 5 DAYS BEFORE ISSUING A FINAL NEGATIVE REPORT [facility]formed at [facility]    Report Status PENDING  Incomplete     Labs: Basic Metabolic Panel:  Recent Labs Lab 10/11/14 1404 10/11/14 1800 10/12/14 0453  NA 134*  --  136  K 3.3*  --  3.9  CL 100  --  103  CO2 24  --  27  GLUCOSE 127*  --  141*  BUN 11  --  9  CREATININE 1.02 0.99 0.79  CALCIUM 8.3*  --  8.3*  MG  --  1.6  --    Liver Function Tests:  Recent Labs Lab 10/11/14 1404 10/11/14 1800 10/12/14 0453  AST 54* 54* 88*  ALT 56* 57* 100*  ALKPHOS 50 45 48  BILITOT 0.7 0.6 0.5  PROT 6.2 5.4* 6.0  ALBUMIN 3.8 3.3* 3.5   No results for input(s): LIPASE, AMYLASE in the last 168 hours. No results for input(s): AMMONIA in the last 168 hours. CBC:  Recent Labs Lab 10/11/14 1404 10/12/14 0453  WBC 9.4 8.2  NEUTROABS 8.2*  --   HGB 13.8 13.3  HCT 39.3 37.3*  MCV 87.5 86.7  PLT 174 187   Cardiac Enzymes:  Recent Labs Lab 10/11/14 1404  TROPONINI <0.03   BNP: BNP (last 3 results)  Recent Labs  10/11/14 1404  BNP 85.5    ProBNP (last 3 results) No results for input(s): PROBNP in the last 8760 hours.  CBG: No results for input(s): GLUCAP in the last 168 hours.   Signed:  CHIU, STEPHEN K  [facility] 10/12/2014, 11:35 AM

## 2014-10-12 NOTE — Discharge Summary (Signed)
Physician Discharge Summary  Renato BattlesRichard D Barkalow FAO:[id number] DOB: 04-17-1961 DOA: 10/11/2014  PCP: Darrow BussingKOIRALA,DIBAS, MD  Admit date: 10/11/2014 Discharge date: 10/12/2014  Time spent: 25 minutes  Recommendations for Outpatient Follow-up:  1. Follow up with PCP in 1-2 weeks 2. Consider prophylactic Tamiflu for close family contacts  Discharge Diagnoses:  Principal Problem:  Influenza A Active Problems:  Acute bronchitis   Discharge Condition: Improved  Diet recommendation: Regular  Filed Weights   10/11/14 1317 10/11/14 1908  Weight: 87.998 kg (194 lb) 87.998 kg (194 lb)    History of present illness:  Please see admit h and p from 3/4 for details. Briefly, pt presents with fevers, cough, sob two days after traveling out of state via air travel while sitting next to acutely ill passengers.  Hospital Course:  The patient was admitted to the floor. The patient was initially started on empiric abx to cover CAP. Flu swab was later found pos for flu A. The patient was started on tamiflu. During the course, the patient has noted improvement with bronchodilators and steroids. He will discharged home with albuterol and prednisone taper. Of note, d-dimer was mildly elevated. CTA was neg for PE. LE dopplers were neg for DVT.  Procedures: B LE dopplers - neg for DVT  Consultations:  none  Discharge Exam: Filed Vitals:   10/11/14 1908 10/11/14 2045 10/11/14 2049 10/12/14 0546  BP: 125/67  121/69 122/75  Pulse: 90  94 65  Temp: 100.2 F (37.9 C)  99.6 F (37.6 C) 98.3 F (36.8 C)  TempSrc: Oral  Oral Oral  Resp: 18  20   Height: 5\' 9"  (1.753 m)     Weight: 87.998 kg (194 lb)     SpO2: 93% 95% 95% 98%    General: awake, in nad Cardiovascular: regular,s1, s2 Respiratory: normal resp effort, no wheezing  Discharge Instructions     Medication List    TAKE these medications       albuterol 108 (90 BASE)  MCG/ACT inhaler  Commonly known as: PROVENTIL HFA;VENTOLIN HFA  Inhale 2 puffs into the lungs every 6 (six) hours as needed for wheezing or shortness of breath.     atorvastatin 10 MG tablet  Commonly known as: LIPITOR  Take 10 mg by mouth daily.     fluticasone 50 MCG/ACT nasal spray  Commonly known as: FLONASE  Place 1 spray into both nostrils as needed for allergies or rhinitis.     ibuprofen 200 MG tablet  Commonly known as: ADVIL,MOTRIN  Take 200 mg by mouth every 6 (six) hours as needed.     oseltamivir 75 MG capsule  Commonly known as: TAMIFLU  Take 1 capsule (75 mg total) by mouth 2 (two) times daily.     predniSONE 5 MG tablet  Commonly known as: DELTASONE  Take 1 tablet (5 mg total) by mouth daily with breakfast.     ZYRTEC ALLERGY PO  Take 10 mg by mouth as needed (allergies, congestion).       No Known Allergies Follow-up Information    Follow up with Darrow BussingKOIRALA,DIBAS, MD. Schedule an appointment as soon as possible for a visit in 1 week.   Specialty: Family Medicine   Contact information:   [address] [place] [address] [phone]         The results of significant diagnostics from this hospitalization (including imaging, microbiology, ancillary and laboratory) are listed below for reference.    Significant Diagnostic Studies:  Imaging Results  Dg Chest 2 View  10/11/2014 CLINICAL DATA: Shortness of breath and fever since 5 days ago. EXAM: CHEST 2 VIEW COMPARISON: None. FINDINGS: Heart size is normal. Mediastinal shadows are normal. There is mild central bronchial thickening but no infiltrate, collapse or effusion. No bony abnormalities. IMPRESSION: Mild bronchial thickening. No consolidation or collapse. Electronically Signed By: Paulina Fusi M.D. On: 10/11/2014 13:52   Ct Angio Chest Pe W/cm &/or Wo Cm  10/11/2014 CLINICAL DATA: Shortness of breath, fever  and cough, 2 days duration. EXAM: CT ANGIOGRAPHY CHEST WITH CONTRAST TECHNIQUE: Multidetector CT imaging of the chest was performed using the standard protocol during bolus administration of intravenous contrast. Multiplanar CT image reconstructions and MIPs were obtained to evaluate the vascular anatomy. CONTRAST: 80mL OMNIPAQUE IOHEXOL 350 MG/ML SOLN COMPARISON: Chest radiography same day FINDINGS: There is widespread bronchial thickening consistent with bronchitis. No evidence of pneumonia. There is mild-to-moderate emphysema. There is a small area of focal scarring posteriorly in the right upper lobe. No evidence of mass lesion. No pleural or pericardial fluid. Pulmonary arterial opacification is excellent. There are no pulmonary emboli. There is mild atherosclerosis of the aorta. No aneurysm or dissection. There may be mild coronary calcification in the left system. There is no mediastinal or hilar mass. Normal size lymph nodes are present. Scans in the upper abdomen are unremarkable. There is a sub cm low density in the right lobe of the liver likely to be benign. No spinal abnormality. Ordinary bridging osteophytes. Review of the MIP images confirms the above findings. IMPRESSION: No pulmonary emboli. Widespread bronchial thickening consistent with bronchitis. No consolidation or collapse. Electronically Signed By: Paulina Fusi M.D. On: 10/11/2014 16:05     Microbiology: Recent Results (from the past 240 hour(s))  Blood Culture (routine x 2) Status: None (Preliminary result)   Collection Time: 10/11/14 1:59 PM  Result Value Ref Range Status   Specimen Description BLOOD UPPER BLOOD LEFT FOREARM  Final   Special Requests BOTTLES DRAWN AEROBIC AND ANAEROBIC 5CCS  Final   Culture   Final     BLOOD CULTURE RECEIVED NO GROWTH TO DATE CULTURE WILL BE HELD FOR 5 DAYS BEFORE ISSUING A FINAL NEGATIVE REPORT Performed at [facility]     Report Status PENDING  Incomplete  Blood Culture (routine x 2) Status: None (Preliminary result)   Collection Time: 10/11/14 2:02 PM  Result Value Ref Range Status   Specimen Description BLOOD LEFT HAND  Final   Special Requests BOTTLES DRAWN AEROBIC AND ANAEROBIC 5CCS  Final   Culture   Final     BLOOD CULTURE RECEIVED NO GROWTH TO DATE CULTURE WILL BE HELD FOR 5 DAYS BEFORE ISSUING A FINAL NEGATIVE REPORT Performed at [facility]    Report Status PENDING  Incomplete     Labs: Basic Metabolic Panel:  Last Labs      Recent Labs Lab 10/11/14 1404 10/11/14 1800 10/12/14 0453  NA 134* --  136  K 3.3* --  3.9  CL 100 --  103  CO2 24 --  27  GLUCOSE 127* --  141*  BUN 11 --  9  CREATININE 1.02 0.99 0.79  CALCIUM 8.3* --  8.3*  MG --  1.6 --      Liver Function Tests:  Last Labs      Recent Labs Lab 10/11/14 1404 10/11/14 1800 10/12/14 0453  AST 54* 54* 88*  ALT 56* 57* 100*  ALKPHOS 50 45 48  BILITOT 0.7 0.6 0.5  PROT  6.2 5.4* 6.0  ALBUMIN 3.8 3.3* 3.5      Last Labs     No results for input(s): LIPASE, AMYLASE in the last 168 hours.    Last Labs     No results for input(s): AMMONIA in the last 168 hours.   CBC:  Last Labs      Recent Labs Lab 10/11/14 1404 10/12/14 0453  WBC 9.4 8.2  NEUTROABS 8.2* --   HGB 13.8 13.3  HCT 39.3 37.3*  MCV 87.5 86.7  PLT 174 187     Cardiac Enzymes:  Last Labs      Recent Labs Lab 10/11/14 1404  TROPONINI <0.03     BNP: BNP (last 3 results)  Recent Labs (within last 365 days)     Recent Labs  10/11/14 1404  BNP 85.5      ProBNP (last 3 results)  Recent Labs (within last 365 days)    No results for input(s): PROBNP in the last 8760 hours.    CBG:  Last Labs     No results for input(s): GLUCAP in the last 168 hours.      Signed:  Xavyer Steenson K [facility] 10/12/2014, 11:35 AM

## 2014-10-12 NOTE — Progress Notes (Signed)
Johnathan Grant to be D/C'd  Home per MD order.  Discussed with the patient and all questions fully answered.    Medication List    TAKE these medications        albuterol 108 (90 BASE) MCG/ACT inhaler  Commonly known as:  PROVENTIL HFA;VENTOLIN HFA  Inhale 2 puffs into the lungs every 6 (six) hours as needed for wheezing or shortness of breath.     atorvastatin 10 MG tablet  Commonly known as:  LIPITOR  Take 10 mg by mouth daily.     fluticasone 50 MCG/ACT nasal spray  Commonly known as:  FLONASE  Place 1 spray into both nostrils as needed for allergies or rhinitis.     ibuprofen 200 MG tablet  Commonly known as:  ADVIL,MOTRIN  Take 200 mg by mouth every 6 (six) hours as needed.     oseltamivir 75 MG capsule  Commonly known as:  TAMIFLU  Take 1 capsule (75 mg total) by mouth 2 (two) times daily.     predniSONE 5 MG tablet  Commonly known as:  DELTASONE  Take 1 tablet (5 mg total) by mouth daily with breakfast.     ZYRTEC ALLERGY PO  Take 10 mg by mouth as needed (allergies, congestion).        VVS, Skin clean, dry and intact without evidence of skin break down, no evidence of skin tears noted. IV catheter discontinued intact. Site without signs and symptoms of complications. Dressing and pressure applied.  An After Visit Summary was printed and given to the patient.  D/c education completed with patient/family including follow up instructions, medication list, d/c activities limitations if indicated, with other d/c instructions as indicated by MD - patient able to verbalize understanding, all questions fully answered.   Patient instructed to return to ED, call 911, or call MD for any changes in condition.   Patient escorted via WC, and D/C home via private auto.  Johnathan DickBennett, Johnathan Grant 10/12/2014 11:48 AM

## 2014-10-13 LAB — URINE CULTURE
Colony Count: NO GROWTH
Culture: NO GROWTH

## 2014-10-13 MED ORDER — BENZONATATE 100 MG PO CAPS: 100.0000 mg | ORAL_CAPSULE | Freq: Three times a day (TID) | ORAL | Status: DC | PRN

## 2014-10-13 MED ORDER — HYDROCOD POLST-CHLORPHEN POLST 10-8 MG/5ML PO LQCR: 5.0000 mL | Freq: Two times a day (BID) | ORAL | Status: DC | PRN

## 2014-10-17 LAB — CULTURE, BLOOD (ROUTINE X 2)
CULTURE: NO GROWTH
Culture: NO GROWTH

## 2016-01-23 IMAGING — CR DG CHEST 2V
2 series · 2 of 2 positions shown · non-contrast
Comparison: None.

CLINICAL DATA: Shortness of breath and fever since 5 days ago.

EXAM:
CHEST  2 VIEW

[chest pa]
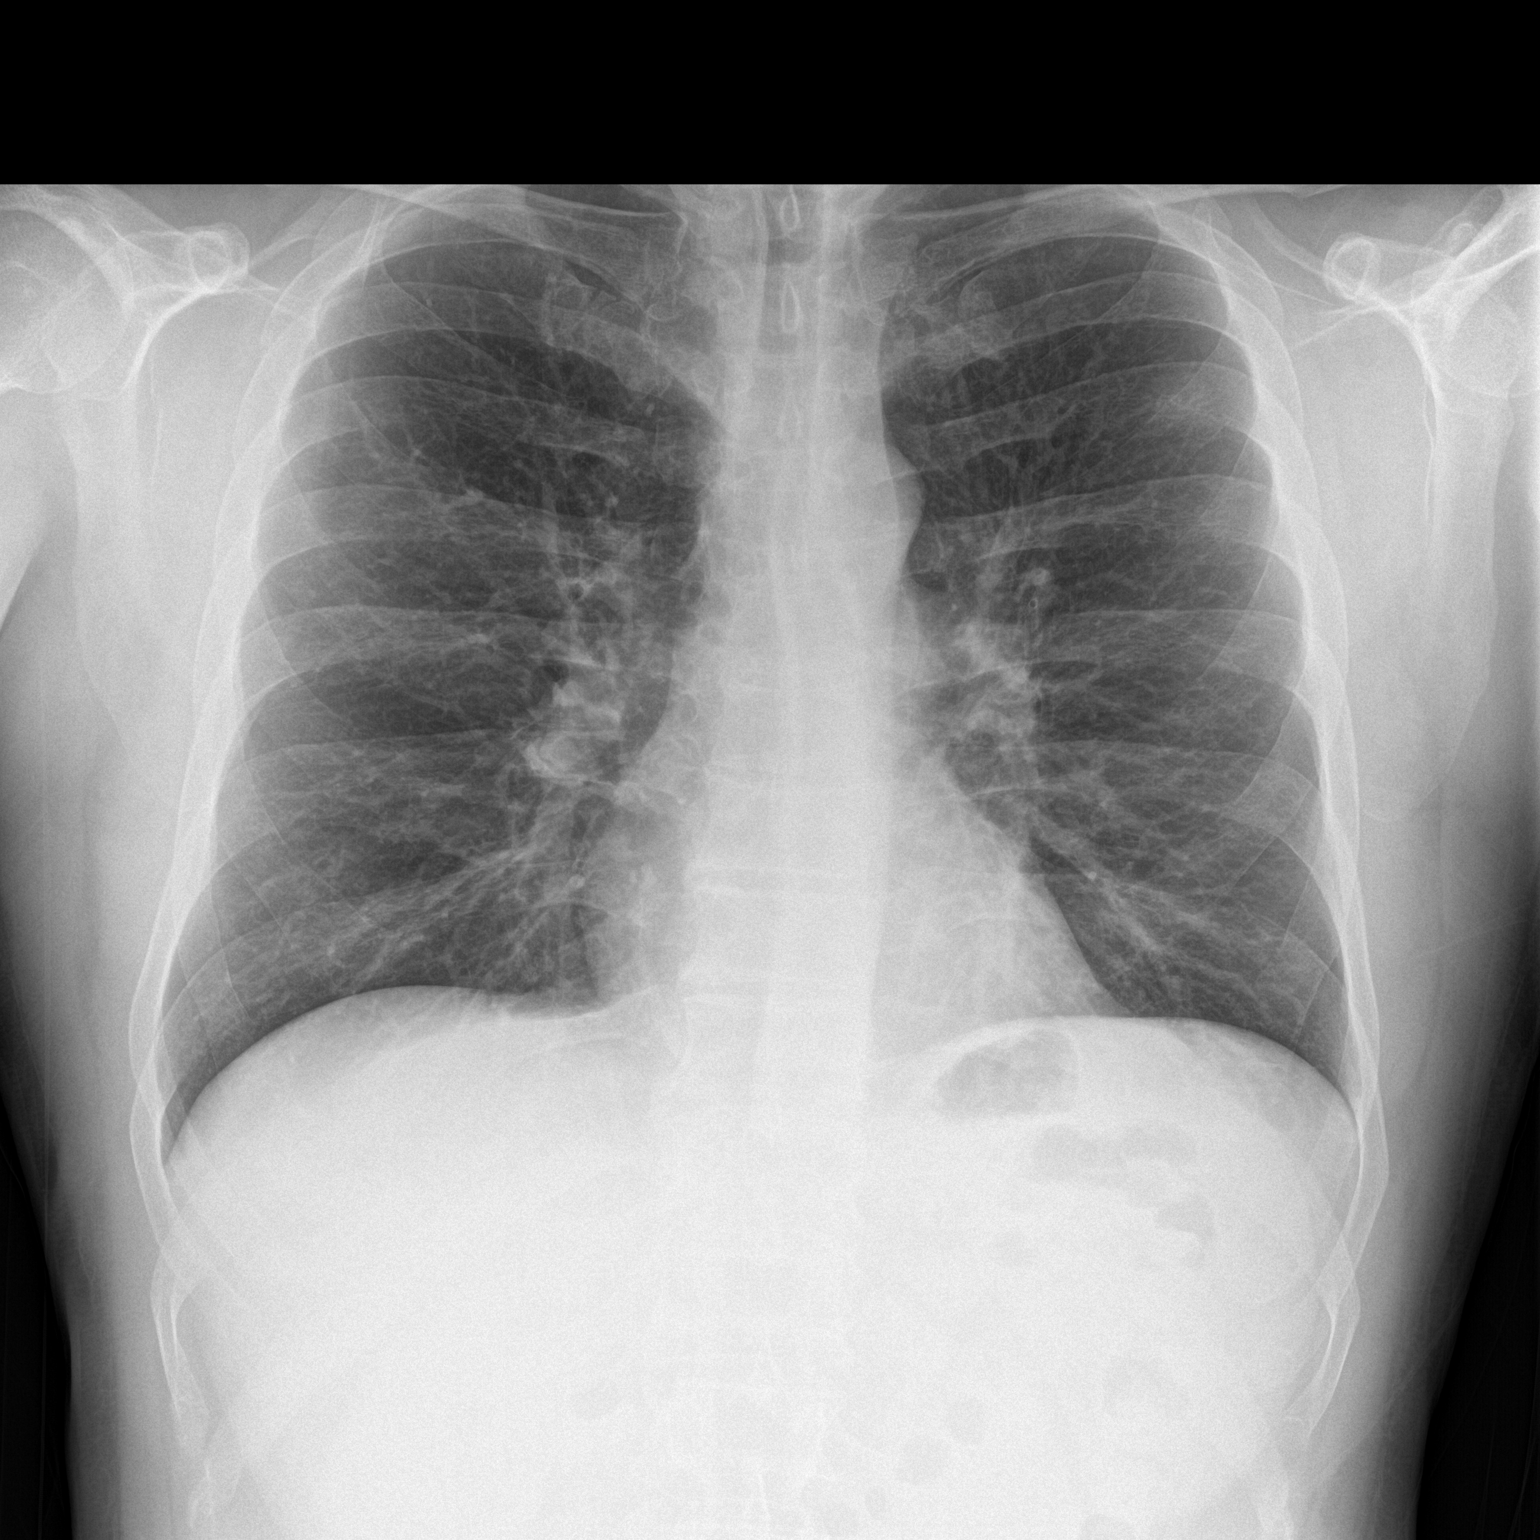

[chest lat]
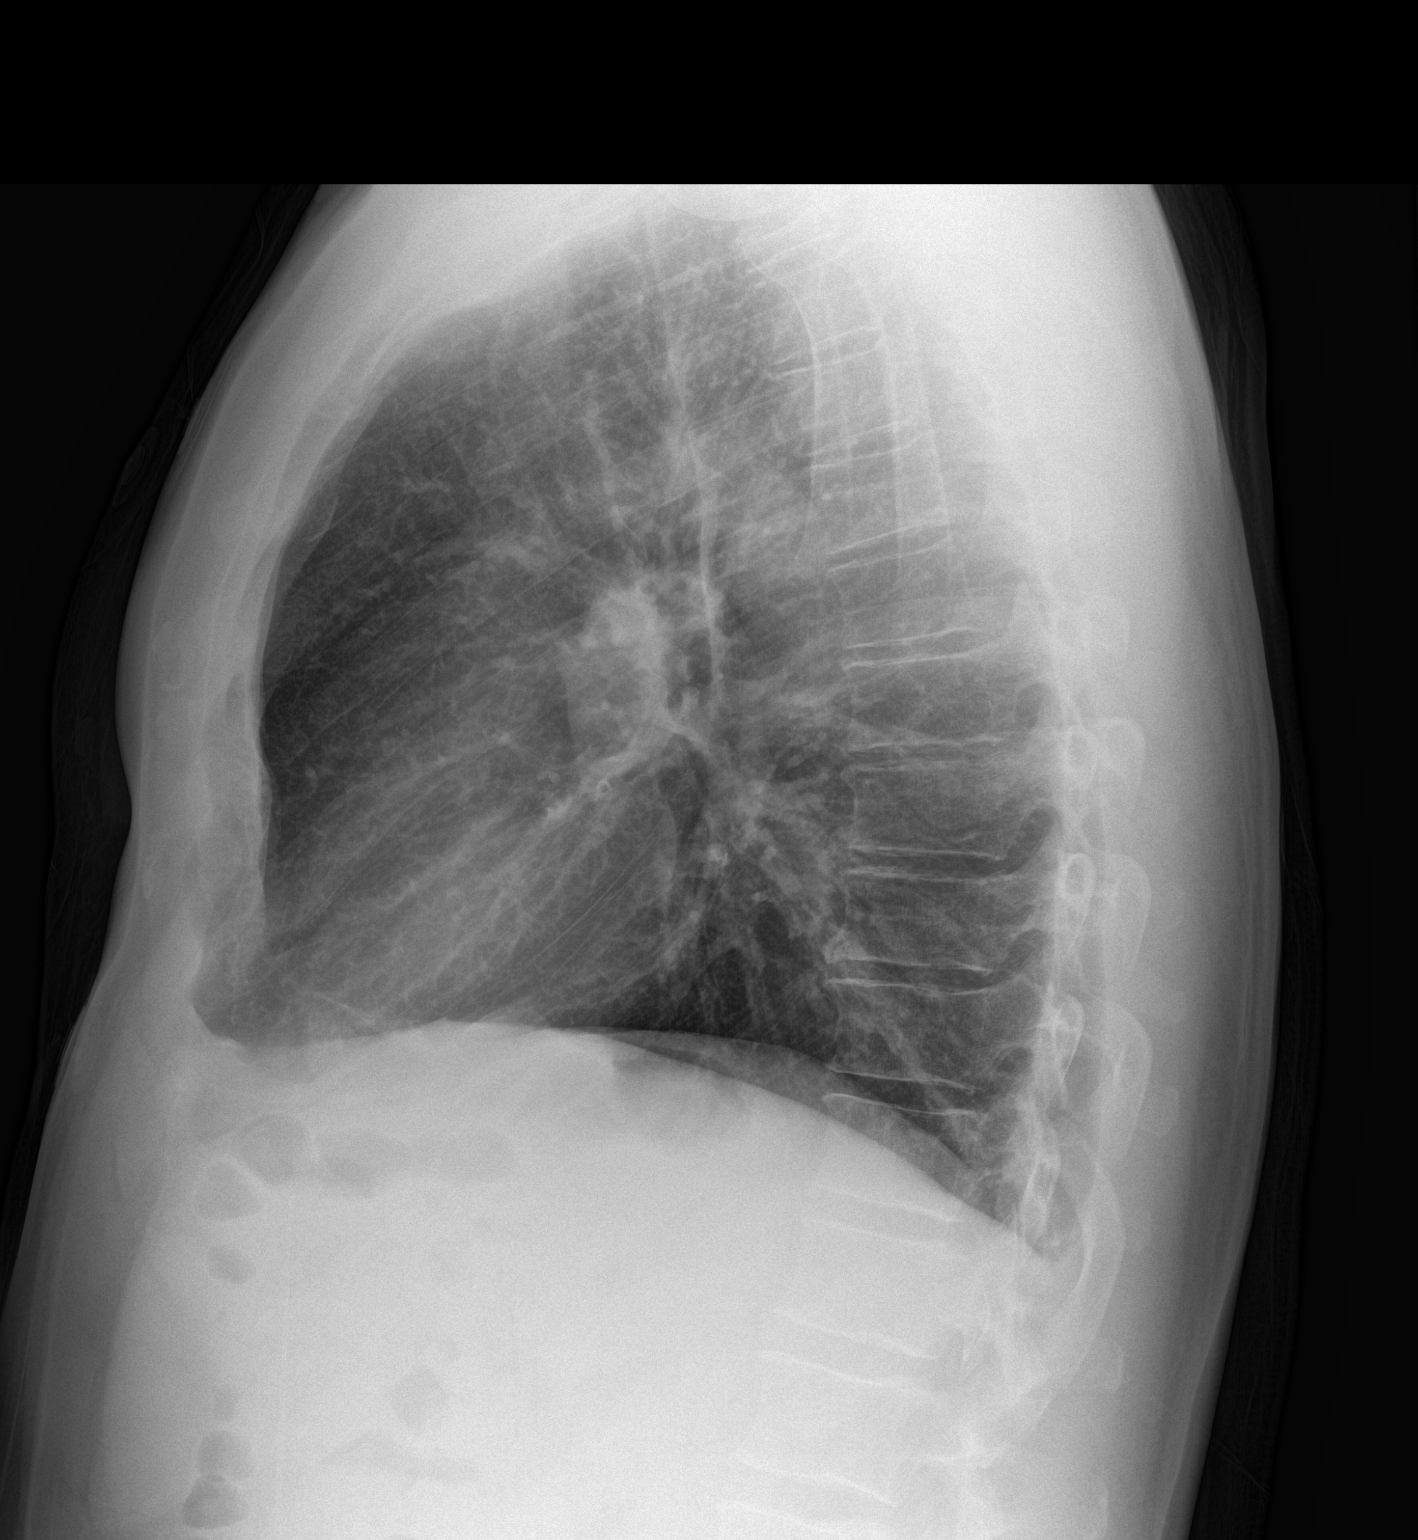

[2 of 2 positions shown; findings below may reference images not displayed]

FINDINGS: Heart size is normal. Mediastinal shadows are normal. There is mild
central bronchial thickening but no infiltrate, collapse or
effusion. No bony abnormalities.
IMPRESSION: Mild bronchial thickening.  No consolidation or collapse.

## 2016-01-23 IMAGING — CT CT ANGIO CHEST
2 of 8 series · 18 of 36 positions shown · IV contrast (omnipaque)
Comparison: Chest radiography same day

CLINICAL DATA: Shortness of breath, fever and cough, 2 days
duration.

EXAM:
CT ANGIOGRAPHY CHEST WITH CONTRAST
TECHNIQUE: Multidetector CT imaging of the chest was performed using the
standard protocol during bolus administration of intravenous
contrast. Multiplanar CT image reconstructions and MIPs were
obtained to evaluate the vascular anatomy.
CONTRAST:  80mL OMNIPAQUE IOHEXOL 350 MG/ML SOLN

[Series 403: thins pacs · axial · 0.81mm/px · z∈[-1073,-785]mm · 17 of 325 slices shown]
[im 19/325  lung]
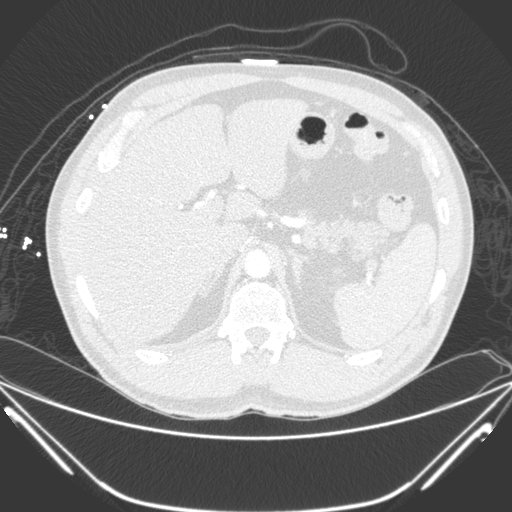
[im 37/325  mediastinal]
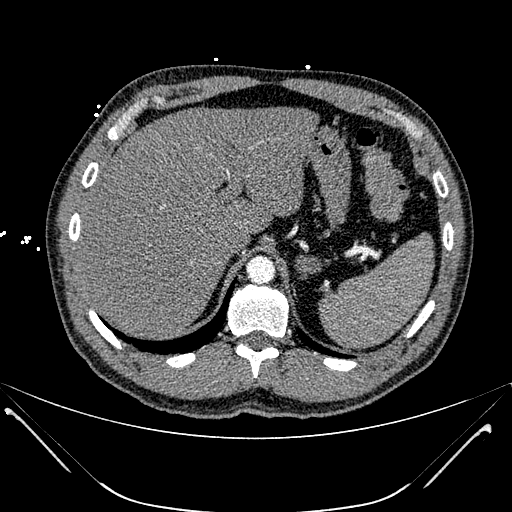
[im 55/325  lung]
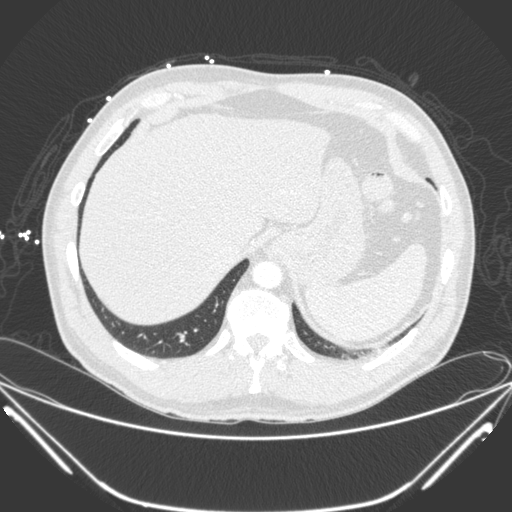
[im 73/325  mediastinal]
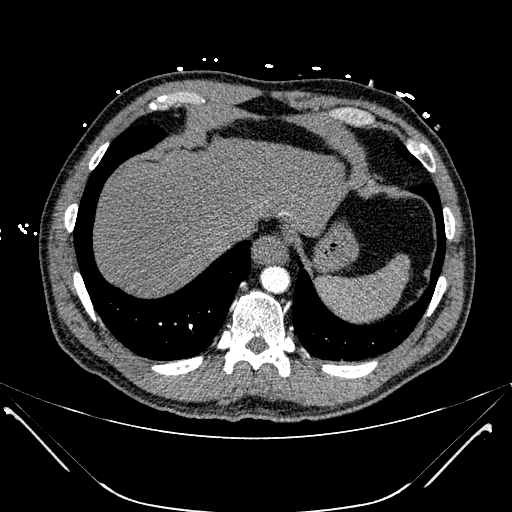
[im 91/325  lung]
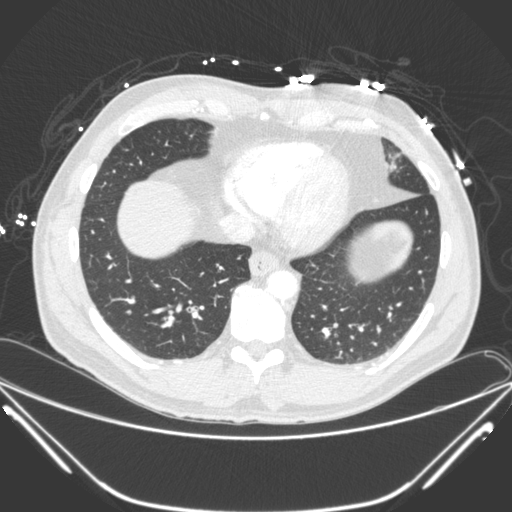
[im 109/325  mediastinal]
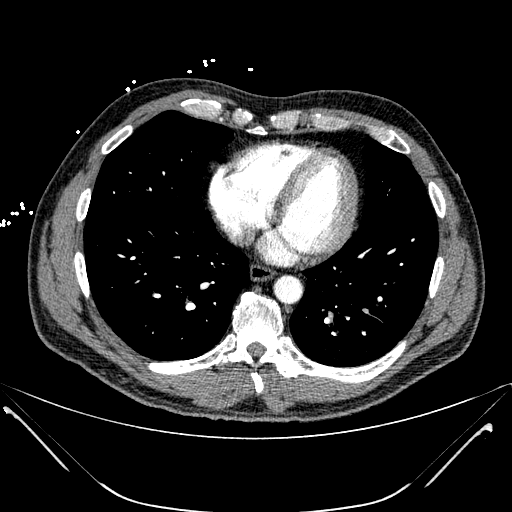
[im 127/325  lung]
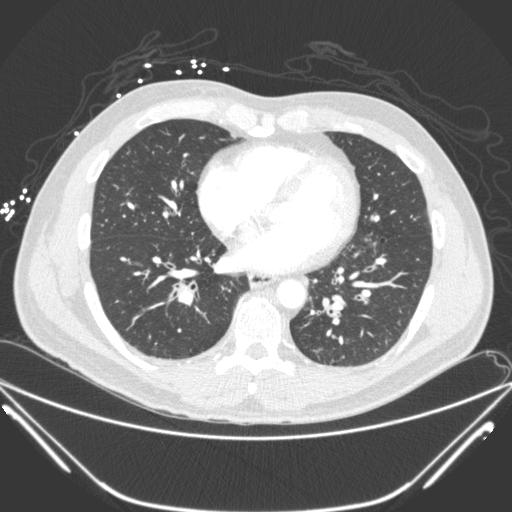
[im 145/325  mediastinal]
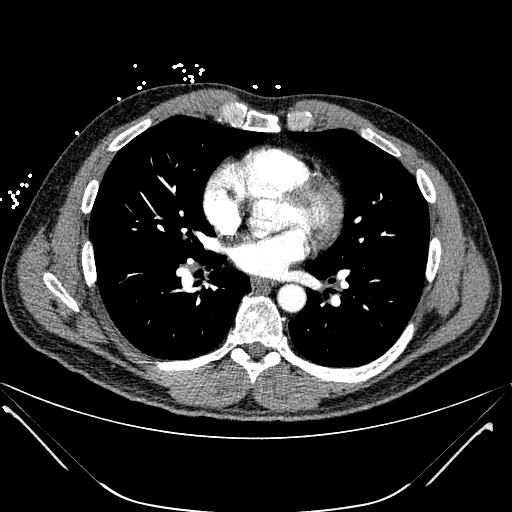
[im 163/325  lung]
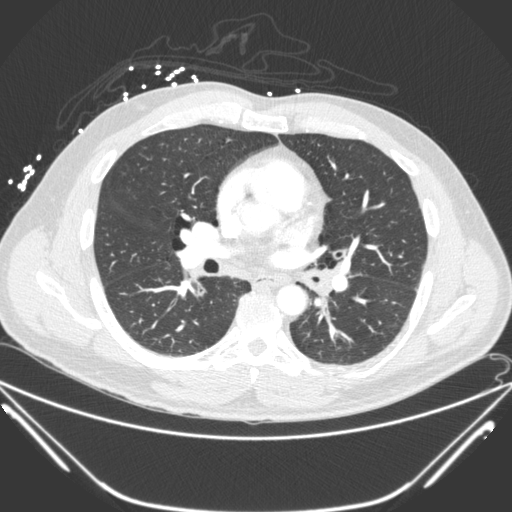
[im 181/325  mediastinal]
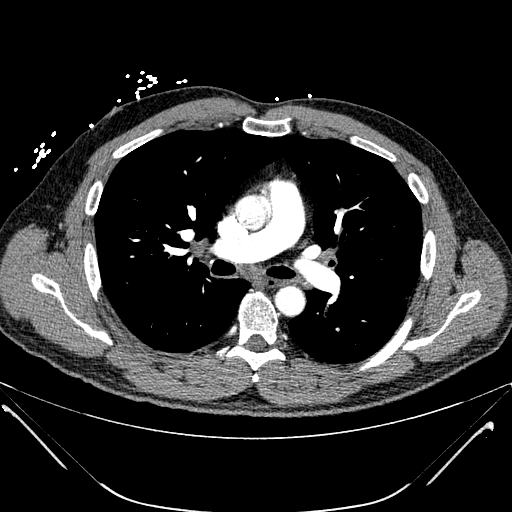
[im 199/325  lung]
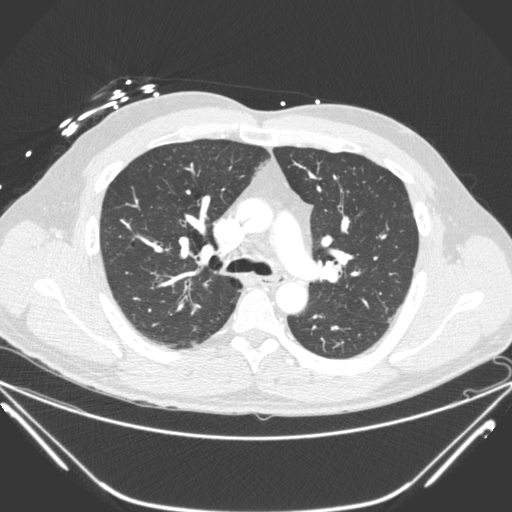
[im 217/325  mediastinal]
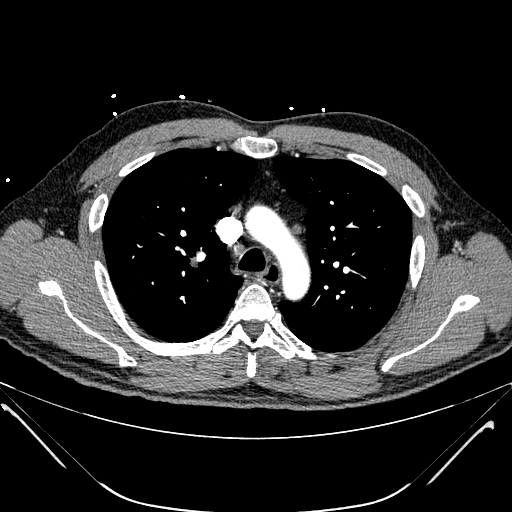
[im 235/325  lung]
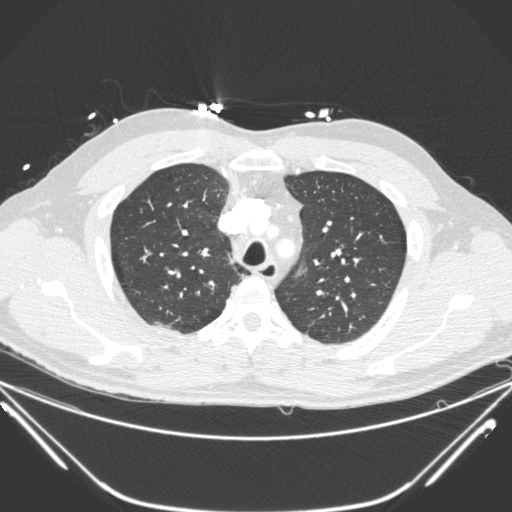
[im 253/325  mediastinal]
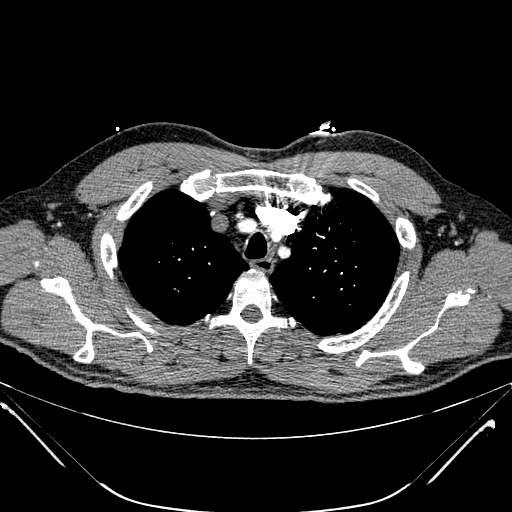
[im 271/325  lung]
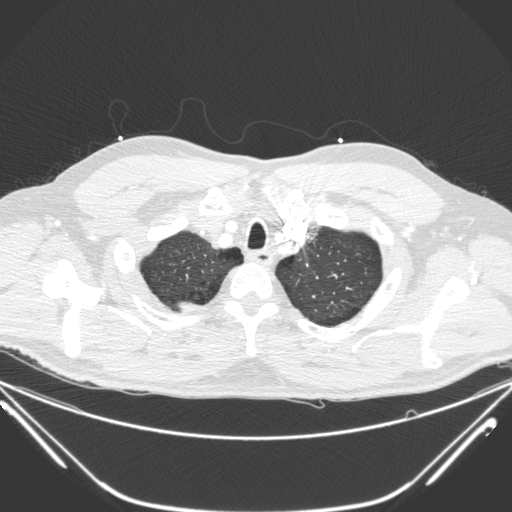
[im 289/325  mediastinal]
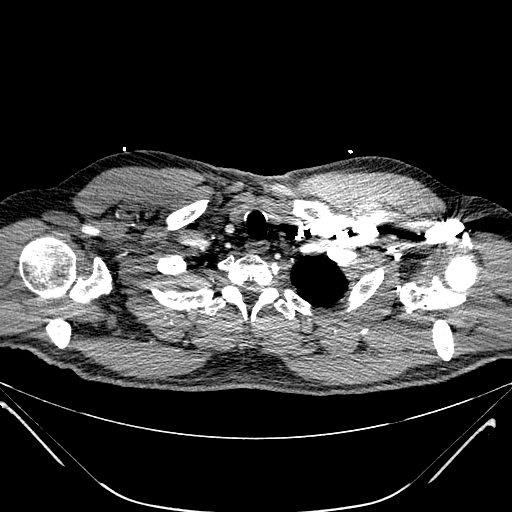
[im 307/325  lung]
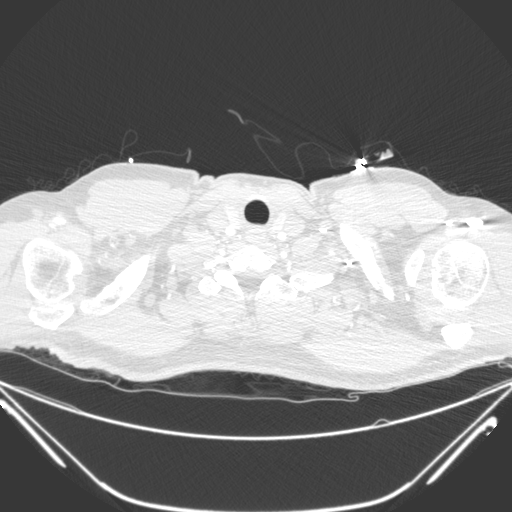

[Series 405: coronal 2x2s · coronal · 0.81mm/px · 1 of 135 slices shown]
[im 68/135  mediastinal]
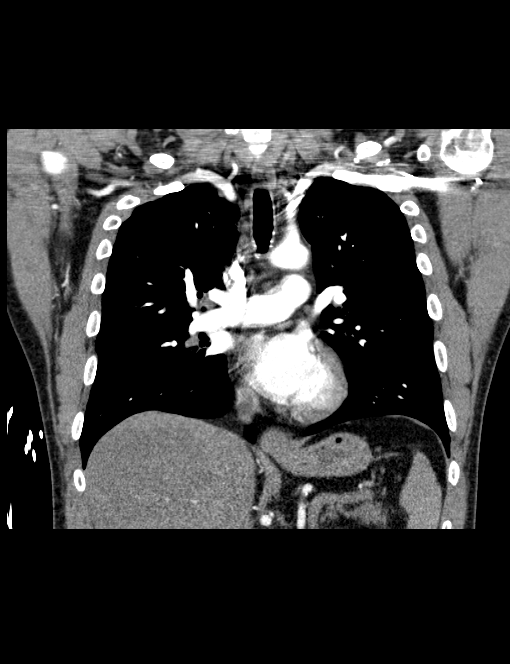

[18 of 36 positions shown; findings below may reference images not displayed]

FINDINGS: There is widespread bronchial thickening consistent with bronchitis.
No evidence of pneumonia. There is mild-to-moderate emphysema. There
is a small area of focal scarring posteriorly in the right upper
lobe. No evidence of mass lesion. No pleural or pericardial fluid.

Pulmonary arterial opacification is excellent. There are no
pulmonary emboli. There is mild atherosclerosis of the aorta. No
aneurysm or dissection. There may be mild coronary calcification in
the left system.

There is no mediastinal or hilar mass. Normal size lymph nodes are
present.

Scans in the upper abdomen are unremarkable. There is a sub cm low
density in the right lobe of the liver likely to be benign.

No spinal abnormality.  Ordinary bridging osteophytes.

Review of the MIP images confirms the above findings.
IMPRESSION: No pulmonary emboli.

Widespread bronchial thickening consistent with bronchitis. No
consolidation or collapse.

## 2016-08-03 ENCOUNTER — Ambulatory Visit
Admission: RE | Admit: 2016-08-03 | Discharge: 2016-08-03 | Disposition: A | Discharge status: Home / Self Care | Payer: 59 | Source: Ambulatory Visit | Attending: Allergy and Immunology | Admitting: Allergy and Immunology

## 2016-08-03 ENCOUNTER — Other Ambulatory Visit: Payer: Self-pay | Admitting: Allergy and Immunology

## 2016-08-03 DIAGNOSIS — R062 Wheezing: Secondary | ICD-10-CM

## 2016-08-30 DIAGNOSIS — G4733 Obstructive sleep apnea (adult) (pediatric): Secondary | ICD-10-CM | POA: Diagnosis not present

## 2016-08-31 DIAGNOSIS — J301 Allergic rhinitis due to pollen: Secondary | ICD-10-CM | POA: Diagnosis not present

## 2016-08-31 DIAGNOSIS — J4540 Moderate persistent asthma, uncomplicated: Secondary | ICD-10-CM | POA: Diagnosis not present

## 2016-08-31 DIAGNOSIS — T781XXA Other adverse food reactions, not elsewhere classified, initial encounter: Secondary | ICD-10-CM | POA: Diagnosis not present

## 2016-08-31 DIAGNOSIS — T63441A Toxic effect of venom of bees, accidental (unintentional), initial encounter: Secondary | ICD-10-CM | POA: Diagnosis not present

## 2016-09-02 DIAGNOSIS — G4700 Insomnia, unspecified: Secondary | ICD-10-CM | POA: Diagnosis not present

## 2016-09-02 DIAGNOSIS — J4540 Moderate persistent asthma, uncomplicated: Secondary | ICD-10-CM | POA: Diagnosis not present

## 2016-09-30 DIAGNOSIS — J301 Allergic rhinitis due to pollen: Secondary | ICD-10-CM | POA: Diagnosis not present

## 2016-09-30 DIAGNOSIS — G4733 Obstructive sleep apnea (adult) (pediatric): Secondary | ICD-10-CM | POA: Diagnosis not present

## 2016-09-30 DIAGNOSIS — J3081 Allergic rhinitis due to animal (cat) (dog) hair and dander: Secondary | ICD-10-CM | POA: Diagnosis not present

## 2016-10-01 DIAGNOSIS — J3089 Other allergic rhinitis: Secondary | ICD-10-CM | POA: Diagnosis not present

## 2016-10-18 DIAGNOSIS — J3089 Other allergic rhinitis: Secondary | ICD-10-CM | POA: Diagnosis not present

## 2016-10-18 DIAGNOSIS — J3081 Allergic rhinitis due to animal (cat) (dog) hair and dander: Secondary | ICD-10-CM | POA: Diagnosis not present

## 2016-10-18 DIAGNOSIS — J301 Allergic rhinitis due to pollen: Secondary | ICD-10-CM | POA: Diagnosis not present

## 2016-10-20 DIAGNOSIS — J3081 Allergic rhinitis due to animal (cat) (dog) hair and dander: Secondary | ICD-10-CM | POA: Diagnosis not present

## 2016-10-20 DIAGNOSIS — J3089 Other allergic rhinitis: Secondary | ICD-10-CM | POA: Diagnosis not present

## 2016-10-20 DIAGNOSIS — J301 Allergic rhinitis due to pollen: Secondary | ICD-10-CM | POA: Diagnosis not present

## 2016-10-22 DIAGNOSIS — T63441D Toxic effect of venom of bees, accidental (unintentional), subsequent encounter: Secondary | ICD-10-CM | POA: Diagnosis not present

## 2016-10-22 DIAGNOSIS — T63461D Toxic effect of venom of wasps, accidental (unintentional), subsequent encounter: Secondary | ICD-10-CM | POA: Diagnosis not present

## 2016-10-22 DIAGNOSIS — T63451D Toxic effect of venom of hornets, accidental (unintentional), subsequent encounter: Secondary | ICD-10-CM | POA: Diagnosis not present

## 2016-10-25 DIAGNOSIS — J301 Allergic rhinitis due to pollen: Secondary | ICD-10-CM | POA: Diagnosis not present

## 2016-10-25 DIAGNOSIS — J3081 Allergic rhinitis due to animal (cat) (dog) hair and dander: Secondary | ICD-10-CM | POA: Diagnosis not present

## 2016-10-25 DIAGNOSIS — J3089 Other allergic rhinitis: Secondary | ICD-10-CM | POA: Diagnosis not present

## 2016-10-27 DIAGNOSIS — J3089 Other allergic rhinitis: Secondary | ICD-10-CM | POA: Diagnosis not present

## 2016-10-27 DIAGNOSIS — J301 Allergic rhinitis due to pollen: Secondary | ICD-10-CM | POA: Diagnosis not present

## 2016-10-27 DIAGNOSIS — J3081 Allergic rhinitis due to animal (cat) (dog) hair and dander: Secondary | ICD-10-CM | POA: Diagnosis not present

## 2016-10-28 DIAGNOSIS — G4733 Obstructive sleep apnea (adult) (pediatric): Secondary | ICD-10-CM | POA: Diagnosis not present

## 2016-10-29 DIAGNOSIS — T63461D Toxic effect of venom of wasps, accidental (unintentional), subsequent encounter: Secondary | ICD-10-CM | POA: Diagnosis not present

## 2016-10-29 DIAGNOSIS — T63441D Toxic effect of venom of bees, accidental (unintentional), subsequent encounter: Secondary | ICD-10-CM | POA: Diagnosis not present

## 2016-10-29 DIAGNOSIS — T63451D Toxic effect of venom of hornets, accidental (unintentional), subsequent encounter: Secondary | ICD-10-CM | POA: Diagnosis not present

## 2016-11-03 DIAGNOSIS — T63441D Toxic effect of venom of bees, accidental (unintentional), subsequent encounter: Secondary | ICD-10-CM | POA: Diagnosis not present

## 2016-11-03 DIAGNOSIS — T63451D Toxic effect of venom of hornets, accidental (unintentional), subsequent encounter: Secondary | ICD-10-CM | POA: Diagnosis not present

## 2016-11-03 DIAGNOSIS — T63461D Toxic effect of venom of wasps, accidental (unintentional), subsequent encounter: Secondary | ICD-10-CM | POA: Diagnosis not present

## 2016-11-10 DIAGNOSIS — J3089 Other allergic rhinitis: Secondary | ICD-10-CM | POA: Diagnosis not present

## 2016-11-10 DIAGNOSIS — J301 Allergic rhinitis due to pollen: Secondary | ICD-10-CM | POA: Diagnosis not present

## 2016-11-10 DIAGNOSIS — J3081 Allergic rhinitis due to animal (cat) (dog) hair and dander: Secondary | ICD-10-CM | POA: Diagnosis not present

## 2016-11-12 DIAGNOSIS — T63441D Toxic effect of venom of bees, accidental (unintentional), subsequent encounter: Secondary | ICD-10-CM | POA: Diagnosis not present

## 2016-11-12 DIAGNOSIS — T63461D Toxic effect of venom of wasps, accidental (unintentional), subsequent encounter: Secondary | ICD-10-CM | POA: Diagnosis not present

## 2016-11-12 DIAGNOSIS — T63451D Toxic effect of venom of hornets, accidental (unintentional), subsequent encounter: Secondary | ICD-10-CM | POA: Diagnosis not present

## 2016-11-15 DIAGNOSIS — J3081 Allergic rhinitis due to animal (cat) (dog) hair and dander: Secondary | ICD-10-CM | POA: Diagnosis not present

## 2016-11-15 DIAGNOSIS — J301 Allergic rhinitis due to pollen: Secondary | ICD-10-CM | POA: Diagnosis not present

## 2016-11-15 DIAGNOSIS — J3089 Other allergic rhinitis: Secondary | ICD-10-CM | POA: Diagnosis not present

## 2016-11-29 DIAGNOSIS — J301 Allergic rhinitis due to pollen: Secondary | ICD-10-CM | POA: Diagnosis not present

## 2016-11-29 DIAGNOSIS — J3089 Other allergic rhinitis: Secondary | ICD-10-CM | POA: Diagnosis not present

## 2016-11-29 DIAGNOSIS — J3081 Allergic rhinitis due to animal (cat) (dog) hair and dander: Secondary | ICD-10-CM | POA: Diagnosis not present

## 2016-12-01 DIAGNOSIS — J3081 Allergic rhinitis due to animal (cat) (dog) hair and dander: Secondary | ICD-10-CM | POA: Diagnosis not present

## 2016-12-01 DIAGNOSIS — J3089 Other allergic rhinitis: Secondary | ICD-10-CM | POA: Diagnosis not present

## 2016-12-01 DIAGNOSIS — J301 Allergic rhinitis due to pollen: Secondary | ICD-10-CM | POA: Diagnosis not present

## 2016-12-03 DIAGNOSIS — J3089 Other allergic rhinitis: Secondary | ICD-10-CM | POA: Diagnosis not present

## 2016-12-03 DIAGNOSIS — J3081 Allergic rhinitis due to animal (cat) (dog) hair and dander: Secondary | ICD-10-CM | POA: Diagnosis not present

## 2016-12-03 DIAGNOSIS — J301 Allergic rhinitis due to pollen: Secondary | ICD-10-CM | POA: Diagnosis not present

## 2016-12-06 DIAGNOSIS — J3089 Other allergic rhinitis: Secondary | ICD-10-CM | POA: Diagnosis not present

## 2016-12-06 DIAGNOSIS — J3081 Allergic rhinitis due to animal (cat) (dog) hair and dander: Secondary | ICD-10-CM | POA: Diagnosis not present

## 2016-12-06 DIAGNOSIS — J301 Allergic rhinitis due to pollen: Secondary | ICD-10-CM | POA: Diagnosis not present

## 2016-12-08 DIAGNOSIS — T63441D Toxic effect of venom of bees, accidental (unintentional), subsequent encounter: Secondary | ICD-10-CM | POA: Diagnosis not present

## 2016-12-08 DIAGNOSIS — T63451D Toxic effect of venom of hornets, accidental (unintentional), subsequent encounter: Secondary | ICD-10-CM | POA: Diagnosis not present

## 2016-12-08 DIAGNOSIS — T63461D Toxic effect of venom of wasps, accidental (unintentional), subsequent encounter: Secondary | ICD-10-CM | POA: Diagnosis not present

## 2016-12-21 DIAGNOSIS — T63441D Toxic effect of venom of bees, accidental (unintentional), subsequent encounter: Secondary | ICD-10-CM | POA: Diagnosis not present

## 2016-12-21 DIAGNOSIS — T63461D Toxic effect of venom of wasps, accidental (unintentional), subsequent encounter: Secondary | ICD-10-CM | POA: Diagnosis not present

## 2016-12-21 DIAGNOSIS — T63451D Toxic effect of venom of hornets, accidental (unintentional), subsequent encounter: Secondary | ICD-10-CM | POA: Diagnosis not present

## 2016-12-31 DIAGNOSIS — T63441D Toxic effect of venom of bees, accidental (unintentional), subsequent encounter: Secondary | ICD-10-CM | POA: Diagnosis not present

## 2016-12-31 DIAGNOSIS — T63451D Toxic effect of venom of hornets, accidental (unintentional), subsequent encounter: Secondary | ICD-10-CM | POA: Diagnosis not present

## 2016-12-31 DIAGNOSIS — T63461D Toxic effect of venom of wasps, accidental (unintentional), subsequent encounter: Secondary | ICD-10-CM | POA: Diagnosis not present

## 2017-01-04 DIAGNOSIS — R002 Palpitations: Secondary | ICD-10-CM | POA: Diagnosis not present

## 2017-01-04 DIAGNOSIS — I10 Essential (primary) hypertension: Secondary | ICD-10-CM | POA: Diagnosis not present

## 2017-01-04 DIAGNOSIS — J3089 Other allergic rhinitis: Secondary | ICD-10-CM | POA: Diagnosis not present

## 2017-01-04 DIAGNOSIS — J3081 Allergic rhinitis due to animal (cat) (dog) hair and dander: Secondary | ICD-10-CM | POA: Diagnosis not present

## 2017-01-04 DIAGNOSIS — J301 Allergic rhinitis due to pollen: Secondary | ICD-10-CM | POA: Diagnosis not present

## 2017-01-06 DIAGNOSIS — J301 Allergic rhinitis due to pollen: Secondary | ICD-10-CM | POA: Diagnosis not present

## 2017-01-06 DIAGNOSIS — J3081 Allergic rhinitis due to animal (cat) (dog) hair and dander: Secondary | ICD-10-CM | POA: Diagnosis not present

## 2017-01-06 DIAGNOSIS — J3089 Other allergic rhinitis: Secondary | ICD-10-CM | POA: Diagnosis not present

## 2017-01-07 DIAGNOSIS — T63461D Toxic effect of venom of wasps, accidental (unintentional), subsequent encounter: Secondary | ICD-10-CM | POA: Diagnosis not present

## 2017-01-07 DIAGNOSIS — T63441D Toxic effect of venom of bees, accidental (unintentional), subsequent encounter: Secondary | ICD-10-CM | POA: Diagnosis not present

## 2017-01-07 DIAGNOSIS — T63451D Toxic effect of venom of hornets, accidental (unintentional), subsequent encounter: Secondary | ICD-10-CM | POA: Diagnosis not present

## 2017-01-10 DIAGNOSIS — J3081 Allergic rhinitis due to animal (cat) (dog) hair and dander: Secondary | ICD-10-CM | POA: Diagnosis not present

## 2017-01-10 DIAGNOSIS — J301 Allergic rhinitis due to pollen: Secondary | ICD-10-CM | POA: Diagnosis not present

## 2017-01-10 DIAGNOSIS — J3089 Other allergic rhinitis: Secondary | ICD-10-CM | POA: Diagnosis not present

## 2017-01-14 DIAGNOSIS — T63441D Toxic effect of venom of bees, accidental (unintentional), subsequent encounter: Secondary | ICD-10-CM | POA: Diagnosis not present

## 2017-01-14 DIAGNOSIS — T63461D Toxic effect of venom of wasps, accidental (unintentional), subsequent encounter: Secondary | ICD-10-CM | POA: Diagnosis not present

## 2017-01-14 DIAGNOSIS — T63451D Toxic effect of venom of hornets, accidental (unintentional), subsequent encounter: Secondary | ICD-10-CM | POA: Diagnosis not present

## 2017-01-17 DIAGNOSIS — J301 Allergic rhinitis due to pollen: Secondary | ICD-10-CM | POA: Diagnosis not present

## 2017-01-17 DIAGNOSIS — J3081 Allergic rhinitis due to animal (cat) (dog) hair and dander: Secondary | ICD-10-CM | POA: Diagnosis not present

## 2017-01-17 DIAGNOSIS — J3089 Other allergic rhinitis: Secondary | ICD-10-CM | POA: Diagnosis not present

## 2017-01-18 DIAGNOSIS — G4700 Insomnia, unspecified: Secondary | ICD-10-CM | POA: Diagnosis not present

## 2017-01-18 DIAGNOSIS — Z0000 Encounter for general adult medical examination without abnormal findings: Secondary | ICD-10-CM | POA: Diagnosis not present

## 2017-01-18 DIAGNOSIS — J4540 Moderate persistent asthma, uncomplicated: Secondary | ICD-10-CM | POA: Diagnosis not present

## 2017-01-18 DIAGNOSIS — Z23 Encounter for immunization: Secondary | ICD-10-CM | POA: Diagnosis not present

## 2017-01-19 DIAGNOSIS — J3081 Allergic rhinitis due to animal (cat) (dog) hair and dander: Secondary | ICD-10-CM | POA: Diagnosis not present

## 2017-01-19 DIAGNOSIS — J3089 Other allergic rhinitis: Secondary | ICD-10-CM | POA: Diagnosis not present

## 2017-01-19 DIAGNOSIS — J301 Allergic rhinitis due to pollen: Secondary | ICD-10-CM | POA: Diagnosis not present

## 2017-01-21 DIAGNOSIS — T63441D Toxic effect of venom of bees, accidental (unintentional), subsequent encounter: Secondary | ICD-10-CM | POA: Diagnosis not present

## 2017-01-21 DIAGNOSIS — T63461D Toxic effect of venom of wasps, accidental (unintentional), subsequent encounter: Secondary | ICD-10-CM | POA: Diagnosis not present

## 2017-01-21 DIAGNOSIS — T63451D Toxic effect of venom of hornets, accidental (unintentional), subsequent encounter: Secondary | ICD-10-CM | POA: Diagnosis not present

## 2017-01-24 DIAGNOSIS — J3081 Allergic rhinitis due to animal (cat) (dog) hair and dander: Secondary | ICD-10-CM | POA: Diagnosis not present

## 2017-01-24 DIAGNOSIS — J3089 Other allergic rhinitis: Secondary | ICD-10-CM | POA: Diagnosis not present

## 2017-01-24 DIAGNOSIS — J301 Allergic rhinitis due to pollen: Secondary | ICD-10-CM | POA: Diagnosis not present

## 2017-01-28 DIAGNOSIS — T63451D Toxic effect of venom of hornets, accidental (unintentional), subsequent encounter: Secondary | ICD-10-CM | POA: Diagnosis not present

## 2017-01-28 DIAGNOSIS — T63461D Toxic effect of venom of wasps, accidental (unintentional), subsequent encounter: Secondary | ICD-10-CM | POA: Diagnosis not present

## 2017-01-28 DIAGNOSIS — T63441D Toxic effect of venom of bees, accidental (unintentional), subsequent encounter: Secondary | ICD-10-CM | POA: Diagnosis not present

## 2017-02-02 DIAGNOSIS — J301 Allergic rhinitis due to pollen: Secondary | ICD-10-CM | POA: Diagnosis not present

## 2017-02-02 DIAGNOSIS — J3081 Allergic rhinitis due to animal (cat) (dog) hair and dander: Secondary | ICD-10-CM | POA: Diagnosis not present

## 2017-02-02 DIAGNOSIS — J3089 Other allergic rhinitis: Secondary | ICD-10-CM | POA: Diagnosis not present

## 2017-02-04 DIAGNOSIS — T63451D Toxic effect of venom of hornets, accidental (unintentional), subsequent encounter: Secondary | ICD-10-CM | POA: Diagnosis not present

## 2017-02-04 DIAGNOSIS — T63461D Toxic effect of venom of wasps, accidental (unintentional), subsequent encounter: Secondary | ICD-10-CM | POA: Diagnosis not present

## 2017-02-04 DIAGNOSIS — T63441D Toxic effect of venom of bees, accidental (unintentional), subsequent encounter: Secondary | ICD-10-CM | POA: Diagnosis not present

## 2017-02-07 DIAGNOSIS — J301 Allergic rhinitis due to pollen: Secondary | ICD-10-CM | POA: Diagnosis not present

## 2017-02-07 DIAGNOSIS — J3081 Allergic rhinitis due to animal (cat) (dog) hair and dander: Secondary | ICD-10-CM | POA: Diagnosis not present

## 2017-02-07 DIAGNOSIS — J3089 Other allergic rhinitis: Secondary | ICD-10-CM | POA: Diagnosis not present

## 2017-02-10 DIAGNOSIS — T63461D Toxic effect of venom of wasps, accidental (unintentional), subsequent encounter: Secondary | ICD-10-CM | POA: Diagnosis not present

## 2017-02-10 DIAGNOSIS — T63441D Toxic effect of venom of bees, accidental (unintentional), subsequent encounter: Secondary | ICD-10-CM | POA: Diagnosis not present

## 2017-02-10 DIAGNOSIS — T63451D Toxic effect of venom of hornets, accidental (unintentional), subsequent encounter: Secondary | ICD-10-CM | POA: Diagnosis not present

## 2017-02-18 DIAGNOSIS — T63451D Toxic effect of venom of hornets, accidental (unintentional), subsequent encounter: Secondary | ICD-10-CM | POA: Diagnosis not present

## 2017-02-18 DIAGNOSIS — T63441D Toxic effect of venom of bees, accidental (unintentional), subsequent encounter: Secondary | ICD-10-CM | POA: Diagnosis not present

## 2017-02-18 DIAGNOSIS — T63461D Toxic effect of venom of wasps, accidental (unintentional), subsequent encounter: Secondary | ICD-10-CM | POA: Diagnosis not present

## 2017-02-21 DIAGNOSIS — J3089 Other allergic rhinitis: Secondary | ICD-10-CM | POA: Diagnosis not present

## 2017-02-21 DIAGNOSIS — J301 Allergic rhinitis due to pollen: Secondary | ICD-10-CM | POA: Diagnosis not present

## 2017-02-21 DIAGNOSIS — J3081 Allergic rhinitis due to animal (cat) (dog) hair and dander: Secondary | ICD-10-CM | POA: Diagnosis not present

## 2017-02-23 DIAGNOSIS — T63451D Toxic effect of venom of hornets, accidental (unintentional), subsequent encounter: Secondary | ICD-10-CM | POA: Diagnosis not present

## 2017-02-23 DIAGNOSIS — T63441D Toxic effect of venom of bees, accidental (unintentional), subsequent encounter: Secondary | ICD-10-CM | POA: Diagnosis not present

## 2017-02-23 DIAGNOSIS — T63461D Toxic effect of venom of wasps, accidental (unintentional), subsequent encounter: Secondary | ICD-10-CM | POA: Diagnosis not present

## 2017-02-25 DIAGNOSIS — J3081 Allergic rhinitis due to animal (cat) (dog) hair and dander: Secondary | ICD-10-CM | POA: Diagnosis not present

## 2017-02-25 DIAGNOSIS — J3089 Other allergic rhinitis: Secondary | ICD-10-CM | POA: Diagnosis not present

## 2017-02-25 DIAGNOSIS — J301 Allergic rhinitis due to pollen: Secondary | ICD-10-CM | POA: Diagnosis not present

## 2017-02-28 DIAGNOSIS — T63441D Toxic effect of venom of bees, accidental (unintentional), subsequent encounter: Secondary | ICD-10-CM | POA: Diagnosis not present

## 2017-02-28 DIAGNOSIS — T63451D Toxic effect of venom of hornets, accidental (unintentional), subsequent encounter: Secondary | ICD-10-CM | POA: Diagnosis not present

## 2017-02-28 DIAGNOSIS — T63461D Toxic effect of venom of wasps, accidental (unintentional), subsequent encounter: Secondary | ICD-10-CM | POA: Diagnosis not present

## 2017-03-07 DIAGNOSIS — T63461D Toxic effect of venom of wasps, accidental (unintentional), subsequent encounter: Secondary | ICD-10-CM | POA: Diagnosis not present

## 2017-03-07 DIAGNOSIS — T63451D Toxic effect of venom of hornets, accidental (unintentional), subsequent encounter: Secondary | ICD-10-CM | POA: Diagnosis not present

## 2017-03-07 DIAGNOSIS — T63441D Toxic effect of venom of bees, accidental (unintentional), subsequent encounter: Secondary | ICD-10-CM | POA: Diagnosis not present

## 2017-03-22 DIAGNOSIS — J3081 Allergic rhinitis due to animal (cat) (dog) hair and dander: Secondary | ICD-10-CM | POA: Diagnosis not present

## 2017-03-22 DIAGNOSIS — J301 Allergic rhinitis due to pollen: Secondary | ICD-10-CM | POA: Diagnosis not present

## 2017-03-22 DIAGNOSIS — J3089 Other allergic rhinitis: Secondary | ICD-10-CM | POA: Diagnosis not present

## 2017-03-25 DIAGNOSIS — T63451D Toxic effect of venom of hornets, accidental (unintentional), subsequent encounter: Secondary | ICD-10-CM | POA: Diagnosis not present

## 2017-03-25 DIAGNOSIS — T63461D Toxic effect of venom of wasps, accidental (unintentional), subsequent encounter: Secondary | ICD-10-CM | POA: Diagnosis not present

## 2017-03-25 DIAGNOSIS — T63441D Toxic effect of venom of bees, accidental (unintentional), subsequent encounter: Secondary | ICD-10-CM | POA: Diagnosis not present

## 2017-04-05 DIAGNOSIS — T63441D Toxic effect of venom of bees, accidental (unintentional), subsequent encounter: Secondary | ICD-10-CM | POA: Diagnosis not present

## 2017-04-05 DIAGNOSIS — T63451D Toxic effect of venom of hornets, accidental (unintentional), subsequent encounter: Secondary | ICD-10-CM | POA: Diagnosis not present

## 2017-04-05 DIAGNOSIS — T63461D Toxic effect of venom of wasps, accidental (unintentional), subsequent encounter: Secondary | ICD-10-CM | POA: Diagnosis not present

## 2017-04-07 DIAGNOSIS — G4733 Obstructive sleep apnea (adult) (pediatric): Secondary | ICD-10-CM | POA: Diagnosis not present

## 2017-04-18 DIAGNOSIS — J3089 Other allergic rhinitis: Secondary | ICD-10-CM | POA: Diagnosis not present

## 2017-04-18 DIAGNOSIS — M542 Cervicalgia: Secondary | ICD-10-CM | POA: Diagnosis not present

## 2017-04-18 DIAGNOSIS — J3081 Allergic rhinitis due to animal (cat) (dog) hair and dander: Secondary | ICD-10-CM | POA: Diagnosis not present

## 2017-04-18 DIAGNOSIS — J301 Allergic rhinitis due to pollen: Secondary | ICD-10-CM | POA: Diagnosis not present

## 2017-05-03 DIAGNOSIS — T63461D Toxic effect of venom of wasps, accidental (unintentional), subsequent encounter: Secondary | ICD-10-CM | POA: Diagnosis not present

## 2017-05-03 DIAGNOSIS — T63451D Toxic effect of venom of hornets, accidental (unintentional), subsequent encounter: Secondary | ICD-10-CM | POA: Diagnosis not present

## 2017-05-03 DIAGNOSIS — T63441D Toxic effect of venom of bees, accidental (unintentional), subsequent encounter: Secondary | ICD-10-CM | POA: Diagnosis not present

## 2017-05-05 DIAGNOSIS — T781XXA Other adverse food reactions, not elsewhere classified, initial encounter: Secondary | ICD-10-CM | POA: Diagnosis not present

## 2017-05-05 DIAGNOSIS — J301 Allergic rhinitis due to pollen: Secondary | ICD-10-CM | POA: Diagnosis not present

## 2017-05-05 DIAGNOSIS — J3081 Allergic rhinitis due to animal (cat) (dog) hair and dander: Secondary | ICD-10-CM | POA: Diagnosis not present

## 2017-05-05 DIAGNOSIS — J4540 Moderate persistent asthma, uncomplicated: Secondary | ICD-10-CM | POA: Diagnosis not present

## 2017-05-05 DIAGNOSIS — T63441A Toxic effect of venom of bees, accidental (unintentional), initial encounter: Secondary | ICD-10-CM | POA: Diagnosis not present

## 2017-05-05 DIAGNOSIS — J3089 Other allergic rhinitis: Secondary | ICD-10-CM | POA: Diagnosis not present

## 2017-05-09 DIAGNOSIS — J301 Allergic rhinitis due to pollen: Secondary | ICD-10-CM | POA: Diagnosis not present

## 2017-05-09 DIAGNOSIS — J3081 Allergic rhinitis due to animal (cat) (dog) hair and dander: Secondary | ICD-10-CM | POA: Diagnosis not present

## 2017-05-09 DIAGNOSIS — J3089 Other allergic rhinitis: Secondary | ICD-10-CM | POA: Diagnosis not present

## 2017-05-11 DIAGNOSIS — T63451D Toxic effect of venom of hornets, accidental (unintentional), subsequent encounter: Secondary | ICD-10-CM | POA: Diagnosis not present

## 2017-05-11 DIAGNOSIS — T63461D Toxic effect of venom of wasps, accidental (unintentional), subsequent encounter: Secondary | ICD-10-CM | POA: Diagnosis not present

## 2017-05-11 DIAGNOSIS — T63441D Toxic effect of venom of bees, accidental (unintentional), subsequent encounter: Secondary | ICD-10-CM | POA: Diagnosis not present

## 2017-05-13 DIAGNOSIS — J301 Allergic rhinitis due to pollen: Secondary | ICD-10-CM | POA: Diagnosis not present

## 2017-05-13 DIAGNOSIS — J3089 Other allergic rhinitis: Secondary | ICD-10-CM | POA: Diagnosis not present

## 2017-05-13 DIAGNOSIS — J3081 Allergic rhinitis due to animal (cat) (dog) hair and dander: Secondary | ICD-10-CM | POA: Diagnosis not present

## 2017-05-16 DIAGNOSIS — T63451D Toxic effect of venom of hornets, accidental (unintentional), subsequent encounter: Secondary | ICD-10-CM | POA: Diagnosis not present

## 2017-05-16 DIAGNOSIS — T63441D Toxic effect of venom of bees, accidental (unintentional), subsequent encounter: Secondary | ICD-10-CM | POA: Diagnosis not present

## 2017-05-16 DIAGNOSIS — T63461D Toxic effect of venom of wasps, accidental (unintentional), subsequent encounter: Secondary | ICD-10-CM | POA: Diagnosis not present

## 2017-05-31 DIAGNOSIS — T63441D Toxic effect of venom of bees, accidental (unintentional), subsequent encounter: Secondary | ICD-10-CM | POA: Diagnosis not present

## 2017-05-31 DIAGNOSIS — T63461D Toxic effect of venom of wasps, accidental (unintentional), subsequent encounter: Secondary | ICD-10-CM | POA: Diagnosis not present

## 2017-05-31 DIAGNOSIS — T63451D Toxic effect of venom of hornets, accidental (unintentional), subsequent encounter: Secondary | ICD-10-CM | POA: Diagnosis not present

## 2017-06-07 DIAGNOSIS — T63441D Toxic effect of venom of bees, accidental (unintentional), subsequent encounter: Secondary | ICD-10-CM | POA: Diagnosis not present

## 2017-06-07 DIAGNOSIS — T63451D Toxic effect of venom of hornets, accidental (unintentional), subsequent encounter: Secondary | ICD-10-CM | POA: Diagnosis not present

## 2017-06-07 DIAGNOSIS — T63461D Toxic effect of venom of wasps, accidental (unintentional), subsequent encounter: Secondary | ICD-10-CM | POA: Diagnosis not present

## 2017-06-24 DIAGNOSIS — J3089 Other allergic rhinitis: Secondary | ICD-10-CM | POA: Diagnosis not present

## 2017-06-24 DIAGNOSIS — J301 Allergic rhinitis due to pollen: Secondary | ICD-10-CM | POA: Diagnosis not present

## 2017-06-24 DIAGNOSIS — J3081 Allergic rhinitis due to animal (cat) (dog) hair and dander: Secondary | ICD-10-CM | POA: Diagnosis not present

## 2017-07-08 DIAGNOSIS — T63461D Toxic effect of venom of wasps, accidental (unintentional), subsequent encounter: Secondary | ICD-10-CM | POA: Diagnosis not present

## 2017-07-08 DIAGNOSIS — T63441D Toxic effect of venom of bees, accidental (unintentional), subsequent encounter: Secondary | ICD-10-CM | POA: Diagnosis not present

## 2017-07-08 DIAGNOSIS — T63451D Toxic effect of venom of hornets, accidental (unintentional), subsequent encounter: Secondary | ICD-10-CM | POA: Diagnosis not present

## 2017-07-11 DIAGNOSIS — J301 Allergic rhinitis due to pollen: Secondary | ICD-10-CM | POA: Diagnosis not present

## 2017-07-11 DIAGNOSIS — J3081 Allergic rhinitis due to animal (cat) (dog) hair and dander: Secondary | ICD-10-CM | POA: Diagnosis not present

## 2017-07-11 DIAGNOSIS — J3089 Other allergic rhinitis: Secondary | ICD-10-CM | POA: Diagnosis not present

## 2017-07-20 DIAGNOSIS — T63441D Toxic effect of venom of bees, accidental (unintentional), subsequent encounter: Secondary | ICD-10-CM | POA: Diagnosis not present

## 2017-07-20 DIAGNOSIS — T63461D Toxic effect of venom of wasps, accidental (unintentional), subsequent encounter: Secondary | ICD-10-CM | POA: Diagnosis not present

## 2017-07-20 DIAGNOSIS — T63451D Toxic effect of venom of hornets, accidental (unintentional), subsequent encounter: Secondary | ICD-10-CM | POA: Diagnosis not present

## 2017-07-25 DIAGNOSIS — J3081 Allergic rhinitis due to animal (cat) (dog) hair and dander: Secondary | ICD-10-CM | POA: Diagnosis not present

## 2017-07-25 DIAGNOSIS — J301 Allergic rhinitis due to pollen: Secondary | ICD-10-CM | POA: Diagnosis not present

## 2017-07-25 DIAGNOSIS — J3089 Other allergic rhinitis: Secondary | ICD-10-CM | POA: Diagnosis not present

## 2017-07-28 DIAGNOSIS — T63451D Toxic effect of venom of hornets, accidental (unintentional), subsequent encounter: Secondary | ICD-10-CM | POA: Diagnosis not present

## 2017-07-28 DIAGNOSIS — T63441D Toxic effect of venom of bees, accidental (unintentional), subsequent encounter: Secondary | ICD-10-CM | POA: Diagnosis not present

## 2017-07-28 DIAGNOSIS — T63461D Toxic effect of venom of wasps, accidental (unintentional), subsequent encounter: Secondary | ICD-10-CM | POA: Diagnosis not present

## 2017-08-15 DIAGNOSIS — T63461D Toxic effect of venom of wasps, accidental (unintentional), subsequent encounter: Secondary | ICD-10-CM | POA: Diagnosis not present

## 2017-08-15 DIAGNOSIS — T63441D Toxic effect of venom of bees, accidental (unintentional), subsequent encounter: Secondary | ICD-10-CM | POA: Diagnosis not present

## 2017-08-15 DIAGNOSIS — T63451D Toxic effect of venom of hornets, accidental (unintentional), subsequent encounter: Secondary | ICD-10-CM | POA: Diagnosis not present

## 2017-08-16 DIAGNOSIS — G4733 Obstructive sleep apnea (adult) (pediatric): Secondary | ICD-10-CM | POA: Diagnosis not present

## 2017-08-31 DIAGNOSIS — T63441D Toxic effect of venom of bees, accidental (unintentional), subsequent encounter: Secondary | ICD-10-CM | POA: Diagnosis not present

## 2017-08-31 DIAGNOSIS — T63451D Toxic effect of venom of hornets, accidental (unintentional), subsequent encounter: Secondary | ICD-10-CM | POA: Diagnosis not present

## 2017-08-31 DIAGNOSIS — T63461D Toxic effect of venom of wasps, accidental (unintentional), subsequent encounter: Secondary | ICD-10-CM | POA: Diagnosis not present

## 2017-10-05 DIAGNOSIS — T63461D Toxic effect of venom of wasps, accidental (unintentional), subsequent encounter: Secondary | ICD-10-CM | POA: Diagnosis not present

## 2017-10-05 DIAGNOSIS — T63441D Toxic effect of venom of bees, accidental (unintentional), subsequent encounter: Secondary | ICD-10-CM | POA: Diagnosis not present

## 2017-10-05 DIAGNOSIS — T63451D Toxic effect of venom of hornets, accidental (unintentional), subsequent encounter: Secondary | ICD-10-CM | POA: Diagnosis not present

## 2017-10-25 DIAGNOSIS — T63441D Toxic effect of venom of bees, accidental (unintentional), subsequent encounter: Secondary | ICD-10-CM | POA: Diagnosis not present

## 2017-10-25 DIAGNOSIS — T63451D Toxic effect of venom of hornets, accidental (unintentional), subsequent encounter: Secondary | ICD-10-CM | POA: Diagnosis not present

## 2017-10-25 DIAGNOSIS — T63461D Toxic effect of venom of wasps, accidental (unintentional), subsequent encounter: Secondary | ICD-10-CM | POA: Diagnosis not present

## 2017-11-01 DIAGNOSIS — T63461D Toxic effect of venom of wasps, accidental (unintentional), subsequent encounter: Secondary | ICD-10-CM | POA: Diagnosis not present

## 2017-11-01 DIAGNOSIS — T63451D Toxic effect of venom of hornets, accidental (unintentional), subsequent encounter: Secondary | ICD-10-CM | POA: Diagnosis not present

## 2017-11-01 DIAGNOSIS — T63441D Toxic effect of venom of bees, accidental (unintentional), subsequent encounter: Secondary | ICD-10-CM | POA: Diagnosis not present

## 2017-11-04 DIAGNOSIS — J301 Allergic rhinitis due to pollen: Secondary | ICD-10-CM | POA: Diagnosis not present

## 2017-11-07 DIAGNOSIS — J3089 Other allergic rhinitis: Secondary | ICD-10-CM | POA: Diagnosis not present

## 2017-11-11 DIAGNOSIS — T63441D Toxic effect of venom of bees, accidental (unintentional), subsequent encounter: Secondary | ICD-10-CM | POA: Diagnosis not present

## 2017-11-11 DIAGNOSIS — T63451D Toxic effect of venom of hornets, accidental (unintentional), subsequent encounter: Secondary | ICD-10-CM | POA: Diagnosis not present

## 2017-11-11 DIAGNOSIS — T63461D Toxic effect of venom of wasps, accidental (unintentional), subsequent encounter: Secondary | ICD-10-CM | POA: Diagnosis not present

## 2017-11-28 DIAGNOSIS — J0101 Acute recurrent maxillary sinusitis: Secondary | ICD-10-CM | POA: Diagnosis not present

## 2017-11-28 DIAGNOSIS — R1031 Right lower quadrant pain: Secondary | ICD-10-CM | POA: Diagnosis not present

## 2017-12-06 DIAGNOSIS — T63441D Toxic effect of venom of bees, accidental (unintentional), subsequent encounter: Secondary | ICD-10-CM | POA: Diagnosis not present

## 2017-12-06 DIAGNOSIS — T63451D Toxic effect of venom of hornets, accidental (unintentional), subsequent encounter: Secondary | ICD-10-CM | POA: Diagnosis not present

## 2018-02-24 DIAGNOSIS — L989 Disorder of the skin and subcutaneous tissue, unspecified: Secondary | ICD-10-CM | POA: Diagnosis not present

## 2018-02-24 DIAGNOSIS — M7021 Olecranon bursitis, right elbow: Secondary | ICD-10-CM | POA: Diagnosis not present

## 2018-03-01 DIAGNOSIS — M7021 Olecranon bursitis, right elbow: Secondary | ICD-10-CM | POA: Diagnosis not present

## 2018-05-09 DIAGNOSIS — Z23 Encounter for immunization: Secondary | ICD-10-CM | POA: Diagnosis not present

## 2018-05-09 DIAGNOSIS — E7800 Pure hypercholesterolemia, unspecified: Secondary | ICD-10-CM | POA: Diagnosis not present

## 2018-05-09 DIAGNOSIS — Z0000 Encounter for general adult medical examination without abnormal findings: Secondary | ICD-10-CM | POA: Diagnosis not present

## 2018-05-09 DIAGNOSIS — R740 Nonspecific elevation of levels of transaminase and lactic acid dehydrogenase [LDH]: Secondary | ICD-10-CM | POA: Diagnosis not present

## 2018-05-24 ENCOUNTER — Other Ambulatory Visit: Payer: Self-pay | Admitting: Family Medicine

## 2018-05-24 DIAGNOSIS — R7401 Elevation of levels of liver transaminase levels: Secondary | ICD-10-CM

## 2018-05-24 DIAGNOSIS — R740 Nonspecific elevation of levels of transaminase and lactic acid dehydrogenase [LDH]: Principal | ICD-10-CM

## 2018-06-21 ENCOUNTER — Ambulatory Visit
Admission: RE | Admit: 2018-06-21 | Discharge: 2018-06-21 | Disposition: A | Discharge status: Home / Self Care | Payer: 59 | Source: Ambulatory Visit | Attending: Family Medicine | Admitting: Family Medicine

## 2018-06-21 DIAGNOSIS — R7401 Elevation of levels of liver transaminase levels: Secondary | ICD-10-CM

## 2018-06-21 DIAGNOSIS — K7689 Other specified diseases of liver: Secondary | ICD-10-CM | POA: Diagnosis not present

## 2018-06-21 DIAGNOSIS — R740 Nonspecific elevation of levels of transaminase and lactic acid dehydrogenase [LDH]: Principal | ICD-10-CM

## 2018-07-11 DIAGNOSIS — J45901 Unspecified asthma with (acute) exacerbation: Secondary | ICD-10-CM | POA: Diagnosis not present

## 2018-07-17 DIAGNOSIS — G4733 Obstructive sleep apnea (adult) (pediatric): Secondary | ICD-10-CM | POA: Diagnosis not present

## 2021-01-22 ENCOUNTER — Encounter: Payer: Self-pay | Admitting: Internal Medicine

## 2021-01-22 ENCOUNTER — Other Ambulatory Visit: Payer: Self-pay

## 2021-01-22 ENCOUNTER — Ambulatory Visit (INDEPENDENT_AMBULATORY_CARE_PROVIDER_SITE_OTHER): Payer: 59 | Admitting: Internal Medicine

## 2021-01-22 VITALS — BP 130/80 | HR 70 | Ht 69.0 in | Wt 188.0 lb

## 2021-01-22 DIAGNOSIS — R0789 Other chest pain: Secondary | ICD-10-CM | POA: Diagnosis not present

## 2021-01-22 DIAGNOSIS — G4733 Obstructive sleep apnea (adult) (pediatric): Secondary | ICD-10-CM

## 2021-01-22 DIAGNOSIS — R002 Palpitations: Secondary | ICD-10-CM

## 2021-01-22 DIAGNOSIS — Z9989 Dependence on other enabling machines and devices: Secondary | ICD-10-CM

## 2021-01-22 DIAGNOSIS — R011 Cardiac murmur, unspecified: Secondary | ICD-10-CM | POA: Diagnosis not present

## 2021-01-22 DIAGNOSIS — Z87891 Personal history of nicotine dependence: Secondary | ICD-10-CM | POA: Insufficient documentation

## 2021-01-22 DIAGNOSIS — I700 Atherosclerosis of aorta: Secondary | ICD-10-CM | POA: Insufficient documentation

## 2021-01-22 MED ORDER — ATORVASTATIN CALCIUM 40 MG PO TABS: 40.0000 mg | ORAL_TABLET | Freq: Every day | ORAL | 3 refills | Status: DC

## 2021-01-22 NOTE — Patient Instructions (Signed)
Medication Instructions:  Your physician has recommended you make the following change in your medication:  INCREASE: atorvastatin to 40 mg by mouth daily *If you need a refill on your cardiac medications before your next appointment, please call your pharmacy*   Lab Work: IN 3 MONTHS If you have labs (blood work) drawn today and your tests are completely normal, you will receive your results only by: MyChart Message (if you have MyChart) OR A paper copy in the mail If you have any lab test that is abnormal or we need to change your treatment, we will call you to review the results.   Testing/Procedures: Your physician has requested that you have an echocardiogram. Echocardiography is a painless test that uses sound waves to create images of your heart. It provides your doctor with information about the size and shape of your heart and how well your heart's chambers and valves are working. This procedure takes approximately one hour. There are no restrictions for this procedure.    Follow-Up: At [facility], you and your health needs are our priority.  As part of our continuing mission to provide you with exceptional heart care, we have created designated Provider Care Teams.  These Care Teams include your primary Cardiologist (physician) and Advanced Practice Providers (APPs -  Physician Assistants and Nurse Practitioners) who all work together to provide you with the care you need, when you need it.  We recommend signing up for the patient portal called "MyChart".  Sign up information is provided on this After Visit Summary.  MyChart is used to connect with patients for Virtual Visits (Telemedicine).  Patients are able to view lab/test results, encounter notes, upcoming appointments, etc.  Non-urgent messages can be sent to your provider as well.   To learn more about what you can do with MyChart, go to [email or web address].    Your next appointment:   6 month(s)  The format for  your next appointment:   In Person  Provider:   You may see Riley Lam, MD or one of the following Advanced Practice Providers on your designated Care Team:   Ronie Spies, PA-C Jacolyn Reedy, PA-C

## 2021-01-22 NOTE — Progress Notes (Signed)
Cardiology Office Note:    Date:  01/22/2021   ID:  Johnathan Grant, DOB 03-27-1961, MRN [id number]  PCP:  Darrow Bussing, MD   [facility] Providers Cardiologist:  Christell Constant, MD     Referring MD: Darrow Bussing, MD   CC: Getting heart checked out Consulted for the evaluation of chest pain at the behest of Koirala, Dibas, MD   History of Present Illness:    Johnathan Grant is a 60 y.o. male with a hx of HLD Aortic atherosclerosis, Former Tobacco Abuse (stopped 2016), and OSA on CPAP who presents for evaluation 01/22/21.  Patient notes that he is feeling occasional chest pain.  Has had this coming and going for years.  Had chest pain last week that may have been associated during or after the gym.  Last for < 5 seconds.  Has also had a chest ache.  Has had no chest pressure, chest tightness, chest stinging.  Discomfort occurs spontaneously and isn't sure if he pulled a muscle.  Patient exertion notable for walking 1-2 miles a day and riding a bike with and feels no symptoms.  No shortness of breath, DOE.  No PND or orthopnea.  No bendopnea, weight gain, leg swelling , or abdominal swelling.  No syncope or near syncope . Notes  rare palpitations & funny heart beats with coffee. These occur once a   Father had a stent at 43 and patient has concerns about chest pain.   Past Medical History:  Diagnosis Date   Hyperlipidemia    OSA (obstructive sleep apnea)     Past Surgical History:  Procedure Laterality Date   NASAL SINUS SURGERY     SHOULDER SURGERY Left     Current Medications: Current Meds  Medication Sig   albuterol (PROVENTIL HFA;VENTOLIN HFA) 108 (90 BASE) MCG/ACT inhaler Inhale 2 puffs into the lungs every 6 (six) hours as needed for wheezing or shortness of breath.   atorvastatin (LIPITOR) 40 MG tablet Take 1 tablet (40 mg total) by mouth daily.   Cetirizine HCl (ZYRTEC ALLERGY PO) Take 10 mg by mouth as needed (allergies, congestion).    fluticasone  (FLONASE) 50 MCG/ACT nasal spray Place 1 spray into both nostrils as needed for allergies or rhinitis.    ibuprofen (ADVIL,MOTRIN) 200 MG tablet Take 200 mg by mouth every 6 (six) hours as needed.   montelukast (SINGULAIR) 10 MG tablet every evening.   [DISCONTINUED] atorvastatin (LIPITOR) 10 MG tablet Take 10 mg by mouth daily.   [DISCONTINUED] atorvastatin (LIPITOR) 20 MG tablet Take 1 tablet by mouth daily.   [DISCONTINUED] benzonatate (TESSALON PERLES) 100 MG capsule Take 1 capsule (100 mg total) by mouth 3 (three) times daily as needed for cough.   [DISCONTINUED] chlorpheniramine-HYDROcodone (TUSSIONEX PENNKINETIC ER) 10-8 MG/5ML LQCR Take 5 mLs by mouth every 12 (twelve) hours as needed for cough.   [DISCONTINUED] oseltamivir (TAMIFLU) 75 MG capsule Take 1 capsule (75 mg total) by mouth 2 (two) times daily.   [DISCONTINUED] predniSONE (DELTASONE) 5 MG tablet Take 1 tablet (5 mg total) by mouth daily with breakfast.     Allergies:   Atorvastatin   Social History   Socioeconomic History   Marital status: Married    Spouse name: Not on file   Number of children: 3   Years of education: Not on file   Highest education level: Not on file  Occupational History    Employer: [employer]  Tobacco Use   Smoking status: Former  Pack years: 0.00    Types: Cigarettes   Smokeless tobacco: Never   Tobacco comments:    Occasional smoker at poker games  Substance and Sexual Activity   Alcohol use: Yes    Comment: occassional   Drug use: Never   Sexual activity: Yes  Other Topics Concern   Not on file  Social History Narrative   Not on file   Social Determinants of Health   Financial Resource Strain: Not on file  Food Insecurity: Not on file  Transportation Needs: Not on file  Physical Activity: Not on file  Stress: Not on file  Social Connections: Not on file    Social: Works for [employer]; goes to the gym at work  Family History: The patient's family history includes  CAD in his father and sister; Hypertension in his mother.  ROS:   Please see the history of present illness.     All other systems reviewed and are negative.  EKGs/Labs/Other Studies Reviewed:    The following studies were reviewed today:  EKG:   01/22/21: SR 70 WNL 10/12/2014: Sinus Tachycardia rate 115  NonCardiac CT: Date: 10/11/2014 Results: Aortic Arch Atherosclerosis without CAC  ECG Stress Testing: Date: 08/28/2013 Results: Non-specific upsloping ST depression inferiorly, <36mm Isolated PVC's noted during recovery. Excellent exercise tolerance for age No exercise induced ischemic EKG changes noted. Negative Bruce treadmill stress test for ischemia.   Recent Labs: No results found for requested labs within last 8760 hours.  Recent Lipid Panel No results found for: CHOL, TRIG, HDL, CHOLHDL, VLDL, LDLCALC, LDLDIRECT  Outside Hospital  or Outside Clinic Studies (OSH):  Date: 12/15/20 Cholesterol 140 HDL 42 LDL 81 Tgs 88 Creatinine 0.9 TSH 2.6 (11/20/2019) BNP NA   Risk Assessment/Calculations:     N/A  Physical Exam:    VS:  BP 130/80   Pulse 70   Ht 5\' 9"  (1.753 m)   Wt 85.3 kg   SpO2 97%   BMI 27.76 kg/m     Wt Readings from Last 3 Encounters:  01/22/21 85.3 kg  10/11/14 88 kg  08/24/13 88.5 kg     GEN:  Well nourished, well developed in no acute distress HEENT: Left sided frank's sign NECK: No JVD; No carotid bruits LYMPHATICS: No lymphadenopathy CARDIAC: regular rate with one irregular beat ; soft systolic murmur no rubs, gallops RESPIRATORY:  Clear to auscultation without rales, wheezing or rhonchi  ABDOMEN: Soft, non-tender, non-distended MUSCULOSKELETAL:  No edema; No deformity  SKIN: Warm and dry NEUROLOGIC:  Alert and oriented x 3 PSYCHIATRIC:  Normal affect   ASSESSMENT:    1. Other chest pain   2. Aortic atherosclerosis (HCC)   3. Heart murmur, systolic   4. Palpitations   5. OSA on CPAP   6. Former tobacco use    PLAN:     Atypical CP Aortic Atherosclerosis & HLD Former Tobacco Abuse OSA on CPAP New Systolic heart murmur Palpitations - Discussed pros and cons of repeat ischemic testing; if worsening sx we have low threshold to do CCTA; deferring for now given atypical nature - will trial decrease in caffeine, but if palpitations persist will start Zio Patch or Preventice based on sx duration - will get echo - will increase atorvastatin to 40 mg PO daily, check lipids and LFTs in 3 months - reviewed CT with patient  Six month follow up unless new symptoms or abnormal test results warranting change in plan          Medication Adjustments/Labs  and Tests Ordered: Current medicines are reviewed at length with the patient today.  Concerns regarding medicines are outlined above.  Orders Placed This Encounter  Procedures   Lipid panel   Hepatic function panel   EKG 12-Lead   ECHOCARDIOGRAM COMPLETE    Meds ordered this encounter  Medications   atorvastatin (LIPITOR) 40 MG tablet    Sig: Take 1 tablet (40 mg total) by mouth daily.    Dispense:  90 tablet    Refill:  3     Patient Instructions  Medication Instructions:  Your physician has recommended you make the following change in your medication:  INCREASE: atorvastatin to 40 mg by mouth daily *If you need a refill on your cardiac medications before your next appointment, please call your pharmacy*   Lab Work: IN 3 MONTHS If you have labs (blood work) drawn today and your tests are completely normal, you will receive your results only by: MyChart Message (if you have MyChart) OR A paper copy in the mail If you have any lab test that is abnormal or we need to change your treatment, we will call you to review the results.   Testing/Procedures: Your physician has requested that you have an echocardiogram. Echocardiography is a painless test that uses sound waves to create images of your heart. It provides your doctor with information  about the size and shape of your heart and how well your heart's chambers and valves are working. This procedure takes approximately one hour. There are no restrictions for this procedure.    Follow-Up: At [facility], you and your health needs are our priority.  As part of our continuing mission to provide you with exceptional heart care, we have created designated Provider Care Teams.  These Care Teams include your primary Cardiologist (physician) and Advanced Practice Providers (APPs -  Physician Assistants and Nurse Practitioners) who all work together to provide you with the care you need, when you need it.  We recommend signing up for the patient portal called "MyChart".  Sign up information is provided on this After Visit Summary.  MyChart is used to connect with patients for Virtual Visits (Telemedicine).  Patients are able to view lab/test results, encounter notes, upcoming appointments, etc.  Non-urgent messages can be sent to your provider as well.   To learn more about what you can do with MyChart, go to [email or web address].    Your next appointment:   6 month(s)  The format for your next appointment:   In Person  Provider:   You may see Riley Lam, MD or one of the following Advanced Practice Providers on your designated Care Team:   Ronie Spies, PA-C Jacolyn Reedy, PA-C      Signed, Christell Constant, MD  01/22/2021 2:01 PM    [facility]

## 2021-02-18 ENCOUNTER — Ambulatory Visit (HOSPITAL_COMMUNITY): Payer: 59 | Attending: Internal Medicine

## 2021-02-18 ENCOUNTER — Other Ambulatory Visit: Payer: Self-pay

## 2021-02-18 DIAGNOSIS — R011 Cardiac murmur, unspecified: Secondary | ICD-10-CM

## 2021-02-18 LAB — ECHOCARDIOGRAM COMPLETE
Area-P 1/2: 3.25 cm2
S' Lateral: 3.8 cm

## 2021-04-14 ENCOUNTER — Ambulatory Visit
Admission: RE | Admit: 2021-04-14 | Discharge: 2021-04-14 | Disposition: A | Discharge status: Home / Self Care | Payer: 59 | Source: Ambulatory Visit | Attending: Family Medicine | Admitting: Family Medicine

## 2021-04-14 ENCOUNTER — Other Ambulatory Visit: Payer: Self-pay | Admitting: Family Medicine

## 2021-04-14 DIAGNOSIS — M79672 Pain in left foot: Secondary | ICD-10-CM

## 2021-04-22 ENCOUNTER — Other Ambulatory Visit: Payer: 59

## 2021-05-26 ENCOUNTER — Other Ambulatory Visit: Payer: 59

## 2021-07-15 ENCOUNTER — Ambulatory Visit: Payer: 59 | Admitting: Internal Medicine

## 2021-07-15 NOTE — Progress Notes (Deleted)
Cardiology Office Note:    Date:  07/15/2021   ID:  Renato Battles, DOB Mar 25, 1961, MRN [id number]  PCP:  Darrow Bussing, MD   [facility] Providers Cardiologist:  Christell Constant, MD     Referring MD: Darrow Bussing, MD   CC: aortic atherosclerosis f/u   History of Present Illness:    Johnathan Grant is a 60 y.o. male with a hx of HLD Aortic atherosclerosis, Former Tobacco Abuse (stopped 2016), and OSA on CPAP who presents for evaluation 01/22/21.  At last visit had atypical CP and was still running several miles.  We discussed CCTA but did not pursue at that time.  Seen 07/15/21.  Patient notes that he is doing ***.   Since day prior/last visit notes *** . There are no interval hospital/ED visit.    No chest pain or pressure ***.  No SOB/DOE*** and no PND/Orthopnea***.  No weight gain or leg swelling***.  No palpitations or syncope ***.  Ambulatory blood pressure ***.    Past Medical History:  Diagnosis Date   Hyperlipidemia    OSA (obstructive sleep apnea)     Past Surgical History:  Procedure Laterality Date   NASAL SINUS SURGERY     SHOULDER SURGERY Left     Current Medications: No outpatient medications have been marked as taking for the 07/15/21 encounter (Appointment) with Christell Constant, MD.     Allergies:   Atorvastatin   Social History   Socioeconomic History   Marital status: Married    Spouse name: Not on file   Number of children: 3   Years of education: Not on file   Highest education level: Not on file  Occupational History    Employer: [employer]  Tobacco Use   Smoking status: Former    Types: Cigarettes   Smokeless tobacco: Never   Tobacco comments:    Occasional smoker at poker games  Substance and Sexual Activity   Alcohol use: Yes    Comment: occassional   Drug use: Never   Sexual activity: Yes  Other Topics Concern   Not on file  Social History Narrative   Not on file   Social Determinants of  Health   Financial Resource Strain: Not on file  Food Insecurity: Not on file  Transportation Needs: Not on file  Physical Activity: Not on file  Stress: Not on file  Social Connections: Not on file    Social: Works for [employer]; goes to the gym at work  Family History: The patient's family history includes CAD in his father and sister; Hypertension in his mother.  ROS:   Please see the history of present illness.     All other systems reviewed and are negative.  EKGs/Labs/Other Studies Reviewed:    The following studies were reviewed today:  EKG:   01/22/21: SR 70 WNL 10/12/2014: Sinus Tachycardia rate 115  Transthoracic Echocardiogram: Date: 02/18/21 Results:  1. Left ventricular ejection fraction, by estimation, is 55 to 60%. The  left ventricle has normal function. The left ventricle has no regional  wall motion abnormalities. There is mild asymmetric left ventricular  hypertrophy of the basal-septal segment.  Left ventricular diastolic parameters are consistent with Grade I  diastolic dysfunction (impaired relaxation).   2. Right ventricular systolic function is normal. The right ventricular  size is normal. Tricuspid regurgitation signal is inadequate for assessing  PA pressure.   3. The mitral valve is normal in structure. Trivial mitral valve  regurgitation.  No evidence of mitral stenosis.   4. The aortic valve is tricuspid. There is mild calcification of the  aortic valve. There is mild thickening of the aortic valve. Aortic valve  regurgitation is not visualized. Mild aortic valve sclerosis is present,  with no evidence of aortic valve    NonCardiac CT: Date: 10/11/2014 Results: Aortic Arch Atherosclerosis without CAC  ECG Stress Testing: Date: 08/28/2013 Results: Non-specific upsloping ST depression inferiorly, <70mm Isolated PVC's noted during recovery. Excellent exercise tolerance for age No exercise induced ischemic EKG changes noted. Negative Bruce  treadmill stress test for ischemia.   Recent Labs: No results found for requested labs within last 8760 hours.  Recent Lipid Panel No results found for: CHOL, TRIG, HDL, CHOLHDL, VLDL, LDLCALC, LDLDIRECT  Outside Hospital  or Outside Clinic Studies (OSH):  Date: 12/15/20 Cholesterol 140 HDL 42 LDL 81 Tgs 88 Creatinine 0.9 TSH 2.6 (11/20/2019) BNP NA   Risk Assessment/Calculations:     N/A  Physical Exam:    VS:  There were no vitals taken for this visit.    Wt Readings from Last 3 Encounters:  01/22/21 188 lb (85.3 kg)  10/11/14 194 lb (88 kg)  08/24/13 195 lb (88.5 kg)     Gen: *** distress, *** obese/well nourished/malnourished   Neck: No JVD, *** carotid bruit Ears: Homero Fellers Sign Cardiac: No Rubs or Gallops, *** Murmur, ***cardia, *** radial pulses prior systolic murmur Respiratory: Clear to auscultation bilaterally, *** effort, ***  respiratory rate GI: Soft, nontender, non-distended *** MS: No *** edema; *** moves all extremities Integument: Skin feels *** Neuro:  At time of evaluation, alert and oriented to person/place/time/situation *** Psych: Normal affect, patient feels ***   ASSESSMENT:    No diagnosis found.  PLAN:    Atypical CP Aortic Atherosclerosis & HLD Former Tobacco Abuse OSA on CPAP New Systolic heart murmur Palpitations - Discussed pros and cons of repeat ischemic testing; if worsening sx we have low threshold to do CCTA; deferring for now given atypical nature - will trial decrease in caffeine, but if palpitations persist will start Zio Patch or Preventice based on sx duration - will get echo - will increase atorvastatin to 40 mg PO daily, check lipids and LFTs in 3 months - reviewed CT with patient  Six month follow up unless new symptoms or abnormal test results warranting change in plan          Medication Adjustments/Labs and Tests Ordered: Current medicines are reviewed at length with the patient today.  Concerns  regarding medicines are outlined above.  No orders of the defined types were placed in this encounter.   No orders of the defined types were placed in this encounter.    There are no Patient Instructions on file for this visit.   Signed, Christell Constant, MD  07/15/2021 8:23 AM    [facility]

## 2022-03-08 ENCOUNTER — Emergency Department (HOSPITAL_BASED_OUTPATIENT_CLINIC_OR_DEPARTMENT_OTHER): Payer: 59 | Admitting: Radiology

## 2022-03-08 ENCOUNTER — Emergency Department (HOSPITAL_BASED_OUTPATIENT_CLINIC_OR_DEPARTMENT_OTHER)
Admission: EM | Admit: 2022-03-08 | Discharge: 2022-03-09 | Disposition: A | Discharge status: Home / Self Care | Payer: 59 | Attending: Emergency Medicine | Admitting: Emergency Medicine

## 2022-03-08 ENCOUNTER — Encounter (HOSPITAL_BASED_OUTPATIENT_CLINIC_OR_DEPARTMENT_OTHER): Payer: Self-pay

## 2022-03-08 DIAGNOSIS — R000 Tachycardia, unspecified: Secondary | ICD-10-CM | POA: Diagnosis not present

## 2022-03-08 DIAGNOSIS — R002 Palpitations: Secondary | ICD-10-CM | POA: Insufficient documentation

## 2022-03-08 DIAGNOSIS — R197 Diarrhea, unspecified: Secondary | ICD-10-CM | POA: Insufficient documentation

## 2022-03-08 DIAGNOSIS — E860 Dehydration: Secondary | ICD-10-CM | POA: Diagnosis not present

## 2022-03-08 LAB — CBC
HCT: 48 % (ref 39.0–52.0)
Hemoglobin: 17.7 g/dL — ABNORMAL HIGH (ref 13.0–17.0)
MCH: 31.4 pg (ref 26.0–34.0)
MCHC: 36.9 g/dL — ABNORMAL HIGH (ref 30.0–36.0)
MCV: 85.3 fL (ref 80.0–100.0)
Platelets: 248 10*3/uL (ref 150–400)
RBC: 5.63 MIL/uL (ref 4.22–5.81)
RDW: 12.3 % (ref 11.5–15.5)
WBC: 7.5 10*3/uL (ref 4.0–10.5)
nRBC: 0 % (ref 0.0–0.2)

## 2022-03-08 LAB — BASIC METABOLIC PANEL
Anion gap: 15 (ref 5–15)
BUN: 26 mg/dL — ABNORMAL HIGH (ref 8–23)
CO2: 22 mmol/L (ref 22–32)
Calcium: 10.5 mg/dL — ABNORMAL HIGH (ref 8.9–10.3)
Chloride: 98 mmol/L (ref 98–111)
Creatinine, Ser: 0.92 mg/dL (ref 0.61–1.24)
GFR, Estimated: >60 mL/min (ref >60)
Glucose, Bld: 138 mg/dL — ABNORMAL HIGH (ref 70–99)
Potassium: 3.6 mmol/L (ref 3.5–5.1)
Sodium: 135 mmol/L (ref 135–145)

## 2022-03-08 LAB — TROPONIN I (HIGH SENSITIVITY): Troponin I (High Sensitivity): 4 ng/L (ref <18)

## 2022-03-08 NOTE — ED Triage Notes (Signed)
Pt reports feeling lightheaded, weak, cramping back pain, tachycardia, epigastric pressure, and HA. States that these symptoms started Sunday night.   Pt A&Ox4 at time of triage. Ambulatory to triage room. EKG obtained. VSS. HR 111 at time of triage. Denies feeling lightheaded at this time.

## 2022-03-09 LAB — TROPONIN I (HIGH SENSITIVITY): Troponin I (High Sensitivity): 5 ng/L (ref <18)

## 2022-03-09 MED ORDER — SODIUM CHLORIDE 0.9 % IV BOLUS
1000.0000 mL | Freq: Once | INTRAVENOUS | Status: AC
  Administered 2022-03-09: 1000 mL via INTRAVENOUS

## 2022-03-09 MED ORDER — ALUM & MAG HYDROXIDE-SIMETH 200-200-20 MG/5ML PO SUSP
30.0000 mL | Freq: Once | ORAL | Status: AC
  Administered 2022-03-09: 30 mL via ORAL
  Filled 2022-03-09: qty 30

## 2022-03-09 NOTE — Discharge Instructions (Signed)
Drink plenty of fluids and get plenty of rest.  Begin taking omeprazole 20 mg twice daily for the next 2 weeks, then 20 mg once daily thereafter.  This medication is available over-the-counter.  Return to the emergency department if your symptoms significantly worsen or change.

## 2022-03-09 NOTE — ED Provider Notes (Signed)
[facility] EMERGENCY DEPT Provider Note   CSN: [id number] Arrival date & time: 03/08/22  1907     History  Chief Complaint  Patient presents with   Tachycardia    Johnathan Grant is a 61 y.o. male.  Patient is a 61 year old male with past medical history of hyperlipidemia, obstructive sleep apnea on CPAP.  Patient presenting today with complaints of palpitations.  He reports working in the yard all day yesterday spreading mulch.  He reports profuse sweating, but push fluids throughout the day to maintain hydration.  That evening, when he was attempting to sleep, he felt as though his heart was beating faster than normal.  He checked his pulse on his Apple Watch and his heart rate was 100.  When he stood up to ambulate, I would increase to 120.  He also describes a feeling of indigestion after eating a tuna sandwich, then had episodes of diarrhea throughout the day today.  He was seen at urgent care, then referred here for further work-up.  He denies to me he is having any chest pain or shortness of breath.  He denies any fevers or chills.  The history is provided by the patient.       Home Medications Prior to Admission medications   Medication Sig Start Date End Date Taking? Authorizing Provider  albuterol (PROVENTIL HFA;VENTOLIN HFA) 108 (90 BASE) MCG/ACT inhaler Inhale 2 puffs into the lungs every 6 (six) hours as needed for wheezing or shortness of breath. 10/12/14   Jerald Kief, MD  atorvastatin (LIPITOR) 40 MG tablet Take 1 tablet (40 mg total) by mouth daily. 01/22/21 04/22/21  Christell Constant, MD  Cetirizine HCl (ZYRTEC ALLERGY PO) Take 10 mg by mouth as needed (allergies, congestion).     [provider]  fluticasone (FLONASE) 50 MCG/ACT nasal spray Place 1 spray into both nostrils as needed for allergies or rhinitis.     [provider]  ibuprofen (ADVIL,MOTRIN) 200 MG tablet Take 200 mg by mouth every 6 (six) hours as needed.     [provider]  montelukast (SINGULAIR) 10 MG tablet every evening. 12/28/20   [provider]      Allergies    Bee venom and Atorvastatin    Review of Systems   Review of Systems  All other systems reviewed and are negative.   Physical Exam Updated Vital Signs BP (!) 134/101   Pulse 77   Temp 98 F (36.7 C)   Resp 10   Ht 5\' 9"  (1.753 m)   Wt 89.4 kg   SpO2 94%   BMI 29.09 kg/m  Physical Exam Vitals and nursing note reviewed.  Constitutional:      General: He is not in acute distress.    Appearance: He is well-developed. He is not diaphoretic.  HENT:     Head: Normocephalic and atraumatic.     Mouth/Throat:     Mouth: Mucous membranes are moist.  Cardiovascular:     Rate and Rhythm: Normal rate and regular rhythm.     Heart sounds: No murmur heard.    No friction rub.  Pulmonary:     Effort: Pulmonary effort is normal. No respiratory distress.     Breath sounds: Normal breath sounds. No wheezing or rales.  Abdominal:     General: Bowel sounds are normal. There is no distension.     Palpations: Abdomen is soft.     Tenderness: There is no abdominal tenderness.  Musculoskeletal:  General: No swelling or tenderness. Normal range of motion.     Cervical back: Normal range of motion and neck supple.     Right lower leg: No edema.     Left lower leg: No edema.  Skin:    General: Skin is warm and dry.  Neurological:     Mental Status: He is alert and oriented to person, place, and time.     Coordination: Coordination normal.     ED Results / Procedures / Treatments   Labs (all labs ordered are listed, but only abnormal results are displayed) Labs Reviewed  BASIC METABOLIC PANEL - Abnormal; Notable for the following components:      Result Value   Glucose, Bld 138 (*)    BUN 26 (*)    Calcium 10.5 (*)    All other components within normal limits  CBC - Abnormal; Notable for the following components:   Hemoglobin 17.7 (*)    MCHC  36.9 (*)    All other components within normal limits  TROPONIN I (HIGH SENSITIVITY)  TROPONIN I (HIGH SENSITIVITY)    EKG EKG Interpretation  Date/Time:  Monday March 08 2022 19:18:53 EDT Ventricular Rate:  114 PR Interval:  132 QRS Duration: 92 QT Interval:  312 QTC Calculation: 430 R Axis:   57 Text Interpretation: Sinus tachycardia with occasional Premature ventricular complexes Nonspecific ST and T wave abnormality Abnormal ECG When compared with ECG of 11-Oct-2014 13:13, T wave inversion no longer evident in Inferior leads Confirmed by Geoffery Lyons ([id number]) on 03/09/2022 12:03:09 AM  Radiology DG Chest 2 View  Result Date: 03/08/2022 CLINICAL DATA:  Chest pain. EXAM: CHEST - 2 VIEW COMPARISON:  Chest radiograph dated 08/03/2016. FINDINGS: The heart size and mediastinal contours are within normal limits. Both lungs are clear. The visualized skeletal structures are unremarkable. IMPRESSION: No active cardiopulmonary disease. Electronically Signed   By: Elgie Collard M.D.   On: 03/08/2022 19:44    Procedures Procedures    Medications Ordered in ED Medications  sodium chloride 0.9 % bolus 1,000 mL (has no administration in time range)  alum & mag hydroxide-simeth (MAALOX/MYLANTA) 200-200-20 MG/5ML suspension 30 mL (has no administration in time range)    ED Course/ Medical Decision Making/ A&P  This patient presents to the ED for concern of palpitations, this involves an extensive number of treatment options, and is a complaint that carries with it a high risk of complications and morbidity.  The differential diagnosis includes dehydration, cardiac arrhythmia, anxiety   Co morbidities that complicate the patient evaluation  None   Additional history obtained:  No additional history or external records needed   Lab Tests:  I Ordered, and personally interpreted labs.  The pertinent results include: Unremarkable CBC and negative troponin x2.  Metabolic panel does show  elevated BUN consistent with dehydration.   Imaging Studies ordered:  None performed   Cardiac Monitoring: / EKG:  The patient was maintained on a cardiac monitor.  I personally viewed and interpreted the cardiac monitored which showed an underlying rhythm of: Sinus rhythm   Consultations Obtained:  No consultations needed   Problem List / ED Course / Critical interventions / Medication management  Patient presenting today with complaints of palpitations and abdominal discomfort as described in the HPI.  He reports working outside the day before and I suspect an element of dehydration as the cause.  His cardiac work-up is unremarkable with unchanged EKG and negative troponin x2.  His electrolytes are consistent with  mild dehydration and patient was given 1 L of normal saline.  I feel as though he can safely be discharged with outpatient follow-up.  He also received a GI cocktail which gave him some relief of his indigestion.  I will advise over-the-counter Prilosec. I have reviewed the patients home medicines and have made adjustments as needed   Social Determinants of Health:  None   Test / Admission - Considered:  No emergent pathology identified that would require admission.  Patient's vitals are stable and he is feeling better.  He will be discharged home with as needed return.  Final Clinical Impression(s) / ED Diagnoses Final diagnoses:  None    Rx / DC Orders ED Discharge Orders     None         Geoffery Lyons, MD 03/09/22 0131

## 2022-03-22 ENCOUNTER — Other Ambulatory Visit: Payer: Self-pay | Admitting: Internal Medicine

## 2022-04-20 ENCOUNTER — Other Ambulatory Visit: Payer: Self-pay | Admitting: Internal Medicine

## 2022-04-30 ENCOUNTER — Other Ambulatory Visit: Payer: Self-pay | Admitting: Internal Medicine

## 2022-05-19 ENCOUNTER — Other Ambulatory Visit: Payer: Self-pay | Admitting: Internal Medicine

## 2022-07-12 ENCOUNTER — Other Ambulatory Visit (HOSPITAL_COMMUNITY): Payer: Self-pay | Admitting: Family Medicine

## 2022-07-12 DIAGNOSIS — E785 Hyperlipidemia, unspecified: Secondary | ICD-10-CM

## 2022-07-22 ENCOUNTER — Encounter: Payer: Self-pay | Admitting: Physical Medicine & Rehabilitation

## 2022-07-26 ENCOUNTER — Other Ambulatory Visit (HOSPITAL_COMMUNITY): Payer: 59

## 2022-07-26 ENCOUNTER — Ambulatory Visit (HOSPITAL_COMMUNITY)
Admission: RE | Admit: 2022-07-26 | Discharge: 2022-07-26 | Disposition: A | Discharge status: Home / Self Care | Payer: 59 | Source: Ambulatory Visit | Attending: Family Medicine | Admitting: Family Medicine

## 2022-07-26 DIAGNOSIS — E785 Hyperlipidemia, unspecified: Secondary | ICD-10-CM | POA: Insufficient documentation

## 2022-09-07 ENCOUNTER — Encounter: Payer: 59 | Attending: Physical Medicine & Rehabilitation | Admitting: Physical Medicine & Rehabilitation

## 2022-09-07 ENCOUNTER — Encounter: Payer: Self-pay | Admitting: Physical Medicine & Rehabilitation

## 2022-09-07 VITALS — BP 168/69 | HR 75 | Ht 69.0 in | Wt 212.0 lb

## 2022-09-07 DIAGNOSIS — M5442 Lumbago with sciatica, left side: Secondary | ICD-10-CM | POA: Diagnosis not present

## 2022-09-07 DIAGNOSIS — M542 Cervicalgia: Secondary | ICD-10-CM

## 2022-09-07 DIAGNOSIS — G8929 Other chronic pain: Secondary | ICD-10-CM | POA: Diagnosis present

## 2022-09-07 DIAGNOSIS — M549 Dorsalgia, unspecified: Secondary | ICD-10-CM | POA: Diagnosis present

## 2022-09-07 MED ORDER — CYCLOBENZAPRINE HCL 5 MG PO TABS: 5.0000 mg | ORAL_TABLET | Freq: Two times a day (BID) | ORAL | 0 refills | Status: AC | PRN
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
## 2022-09-07 NOTE — Patient Instructions (Signed)
Back Exercises These exercises help to make your trunk and back strong. They also help to keep the lower back flexible. Doing these exercises can help to prevent or lessen pain in your lower back. If you have back pain, try to do these exercises 2-3 times each day or as told by your doctor. As you get better, do the exercises once each day. Repeat the exercises more often as told by your doctor. To stop back pain from coming back, do the exercises once each day, or as told by your doctor. Do exercises exactly as told by your doctor. Stop right away if you feel sudden pain or your pain gets worse. Exercises Single knee to chest Do these steps 3-5 times in a row for each leg: Lie on your back on a firm bed or the floor with your legs stretched out. Bring one knee to your chest. Grab your knee or thigh with both hands and hold it in place. Pull on your knee until you feel a gentle stretch in your lower back or butt. Keep doing the stretch for 10-30 seconds. Slowly let go of your leg and straighten it. Pelvic tilt Do these steps 5-10 times in a row: Lie on your back on a firm bed or the floor with your legs stretched out. Bend your knees so they point up to the ceiling. Your feet should be flat on the floor. Tighten your lower belly (abdomen) muscles to press your lower back against the floor. This will make your tailbone point up to the ceiling instead of pointing down to your feet or the floor. Stay in this position for 5-10 seconds while you gently tighten your muscles and breathe evenly. Cat-cow Do these steps until your lower back bends more easily: Get on your hands and knees on a firm bed or the floor. Keep your hands under your shoulders, and keep your knees under your hips. You may put padding under your knees. Let your head hang down toward your chest. Tighten (contract) the muscles in your belly. Point your tailbone toward the floor so your lower back becomes rounded like the back of a  cat. Stay in this position for 5 seconds. Slowly lift your head. Let the muscles of your belly relax. Point your tailbone up toward the ceiling so your back forms a sagging arch like the back of a cow. Stay in this position for 5 seconds.  Press-ups Do these steps 5-10 times in a row: Lie on your belly (face-down) on a firm bed or the floor. Place your hands near your head, about shoulder-width apart. While you keep your back relaxed and keep your hips on the floor, slowly straighten your arms to raise the top half of your body and lift your shoulders. Do not use your back muscles. You may change where you place your hands to make yourself more comfortable. Stay in this position for 5 seconds. Keep your back relaxed. Slowly return to lying flat on the floor.  Bridges Do these steps 10 times in a row: Lie on your back on a firm bed or the floor. Bend your knees so they point up to the ceiling. Your feet should be flat on the floor. Your arms should be flat at your sides, next to your body. Tighten your butt muscles and lift your butt off the floor until your waist is almost as high as your knees. If you do not feel the muscles working in your butt and the back of   your thighs, slide your feet 1-2 inches (2.5-5 cm) farther away from your butt. Stay in this position for 3-5 seconds. Slowly lower your butt to the floor, and let your butt muscles relax. If this exercise is too easy, try doing it with your arms crossed over your chest. Belly crunches Do these steps 5-10 times in a row: Lie on your back on a firm bed or the floor with your legs stretched out. Bend your knees so they point up to the ceiling. Your feet should be flat on the floor. Cross your arms over your chest. Tip your chin a little bit toward your chest, but do not bend your neck. Tighten your belly muscles and slowly raise your chest just enough to lift your shoulder blades a tiny bit off the floor. Avoid raising your body  higher than that because it can put too much stress on your lower back. Slowly lower your chest and your head to the floor. Back lifts Do these steps 5-10 times in a row: Lie on your belly (face-down) with your arms at your sides, and rest your forehead on the floor. Tighten the muscles in your legs and your butt. Slowly lift your chest off the floor while you keep your hips on the floor. Keep the back of your head in line with the curve in your back. Look at the floor while you do this. Stay in this position for 3-5 seconds. Slowly lower your chest and your face to the floor. Contact a doctor if: Your back pain gets a lot worse when you do an exercise. Your back pain does not get better within 2 hours after you exercise. If you have any of these problems, stop doing the exercises. Do not do them again unless your doctor says it is okay. Get help right away if: You have sudden, very bad back pain. If this happens, stop doing the exercises. Do not do them again unless your doctor says it is okay. This information is not intended to replace advice given to you by your health care provider. Make sure you discuss any questions you have with your health care provider. Document Revised: 10/08/2020 Document Reviewed: 10/08/2020 Elsevier Patient Education  2023 [employer]  

## 2022-10-28 ENCOUNTER — Encounter: Payer: 59 | Admitting: Physical Medicine & Rehabilitation

## 2023-01-13 ENCOUNTER — Other Ambulatory Visit: Payer: Self-pay | Admitting: Internal Medicine

## 2023-01-13 NOTE — Telephone Encounter (Signed)
Pt has requested refill of Atorvastatin,last seen 01/22/21 and has had several attempts to schedule f/u. Please advise, thank you!

## 2023-07-27 ENCOUNTER — Other Ambulatory Visit: Payer: Self-pay | Admitting: Internal Medicine

## 2023-12-25 ENCOUNTER — Other Ambulatory Visit: Payer: Self-pay | Admitting: Internal Medicine

## 2023-12-27 NOTE — Telephone Encounter (Signed)
 Pt of Dr. Paulita Boss. Last OV was on 01/22/21. Passed 3 attempts. Does Dr Paulita Boss want to refill? Please advise

## 2024-06-12 ENCOUNTER — Encounter: Payer: Self-pay | Admitting: Physician Assistant

## 2024-06-12 ENCOUNTER — Ambulatory Visit: Admitting: Physician Assistant

## 2024-06-12 VITALS — BP 137/86

## 2024-06-12 DIAGNOSIS — R748 Abnormal levels of other serum enzymes: Secondary | ICD-10-CM | POA: Diagnosis not present

## 2024-06-12 DIAGNOSIS — L299 Pruritus, unspecified: Secondary | ICD-10-CM | POA: Diagnosis not present

## 2024-06-12 MED ORDER — CLOBETASOL PROPIONATE 0.05 % EX OINT: 1.0000 | TOPICAL_OINTMENT | Freq: Two times a day (BID) | CUTANEOUS | 1 refills | Status: AC

## 2024-06-12 NOTE — Patient Instructions (Signed)

## 2024-06-12 NOTE — Progress Notes (Signed)
 New Patient Visit   Discussed the use of AI scribe software for clinical note transcription with the patient, who gave verbal consent to proceed.  History of Present Illness Johnathan Grant is a 63 year old male NEW PATIENT who presents with chronic itching and skin lesions.  He has experienced chronic itching and skin lesions for over a year, primarily during the fall and winter months. The lesions are intensely pruritic, resembling insect bites, and are mostly located on his right side, lower extremities, and arms. Scratching at night sometimes leads to scarring. The pruritus can be severe enough to disturb his sleep.  He was evaluated for itching in December 2024 by his PCP. 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 He takes Zyrtec almost daily for allergies, which he believes causes fatigue despite being a non-sedating antihistamine. He has tried Benadryl but dislikes its side effects.  He lives with two dogs and a cat, but neither he nor the animals have shown signs of itching. He suspects environmental allergies as a potential cause, as he is allergic to many environmental factors.  He has a family history of high liver enzymes and high triglycerides, and his liver enzymes have been elevated for years, which he attributes to a fatty liver. He occasionally consumes alcohol. He has not been referred to a gastroenterologist for further evaluation of his liver condition.  He is retired and spends time caring for his granddaughter. He enjoys outdoor activities, such as walking his dogs.      The following portions of the chart were reviewed this encounter and updated as appropriate: medications, allergies, medical history  Review of Systems:  No other skin or systemic complaints except as noted in HPI or Assessment and Plan.  Objective  Well appearing patient in no apparent distress; mood and affect are within normal limits.   A focused  examination was performed of the following areas: Face, chest, back, arms and legs   Relevant exam findings are noted in the Assessment and Plan.       Assessment & Plan Pruritus of unclear etiology Intermittent pruritus affecting lower extremities and arms, worse at night, with seasonal pattern. Differential includes dry skin, allergies, and liver-related causes. Current Zyrtec use with persistent symptoms. Systemic treatments like Dupixent not necessary due to side effects and absence of urticaria. - Prescribe clobetasol topical steroid for affected areas twice daily during itching. - Continue Zyrtec, option to take twice daily, 12 hours apart, if needed. - Schedule follow-up in a couple of months for treatment response and full skin check.  Elevated liver enzymes, possible fatty liver disease Elevated AST and ALT with familial high triglycerides suggest possible fatty liver. Hepatic impairment can cause pruritus.  - Recommend gastroenterologist consultation for elevated liver enzymes and potential fatty liver. - Consider liver ultrasound for fatty liver assessment. - Discuss with primary care for further management and potential imaging or biopsy.      PRURITUS Exam: No evident rash today  Treatment Plan: Clobetasol ointment apply to affected areas twice daily as needed for itch  Recommend Zrytec twice daily.  Recommend he follow up with his PCP or a gastroenterologist for his elevated LFTs. ELEVATED LIVER ENZYMES   PRURITUS    Return in about 2 months (around 08/12/2024) for Follow up, TBSE.  I, Roseline Hutchinson, CMA, am acting as scribe for Leston Schueller K, PA-C .   Documentation: I have reviewed the above documentation for accuracy and  completeness, and I agree with the above.  Arleigh Dicola K, PA-C

## 2024-08-15 ENCOUNTER — Ambulatory Visit: Admitting: Physician Assistant

## 2024-10-08 ENCOUNTER — Ambulatory Visit: Admitting: Physician Assistant
